# Patient Record
Sex: Male | Born: 1960 | Race: White | Hispanic: No | Marital: Single | Smoking: Never smoker
Health system: Southern US, Community
[De-identification: ages and names within clinical notes are randomized; demographics above are authoritative.]

## PROBLEM LIST (undated history)

## (undated) DIAGNOSIS — I639 Cerebral infarction, unspecified: Secondary | ICD-10-CM

## (undated) DIAGNOSIS — I739 Peripheral vascular disease, unspecified: Secondary | ICD-10-CM

## (undated) DIAGNOSIS — E119 Type 2 diabetes mellitus without complications: Secondary | ICD-10-CM

## (undated) DIAGNOSIS — I2510 Atherosclerotic heart disease of native coronary artery without angina pectoris: Secondary | ICD-10-CM

## (undated) DIAGNOSIS — I219 Acute myocardial infarction, unspecified: Secondary | ICD-10-CM

## (undated) DIAGNOSIS — I10 Essential (primary) hypertension: Secondary | ICD-10-CM

## (undated) HISTORY — PX: CORONARY ARTERY BYPASS GRAFT: SHX141

---

## 2015-06-19 ENCOUNTER — Inpatient Hospital Stay: Payer: Managed Care, Other (non HMO)

## 2015-06-19 ENCOUNTER — Inpatient Hospital Stay
Admission: AD | Admit: 2015-06-19 | Discharge: 2015-06-28 | DRG: 240 | Disposition: A | Discharge status: Skilled Nursing Facility | Payer: Managed Care, Other (non HMO) | Source: Other Acute Inpatient Hospital | Attending: Vascular Surgery | Admitting: Vascular Surgery

## 2015-06-19 DIAGNOSIS — M19071 Primary osteoarthritis, right ankle and foot: Secondary | ICD-10-CM | POA: Diagnosis present

## 2015-06-19 DIAGNOSIS — L02611 Cutaneous abscess of right foot: Secondary | ICD-10-CM | POA: Diagnosis present

## 2015-06-19 DIAGNOSIS — K5900 Constipation, unspecified: Secondary | ICD-10-CM | POA: Diagnosis not present

## 2015-06-19 DIAGNOSIS — E1122 Type 2 diabetes mellitus with diabetic chronic kidney disease: Secondary | ICD-10-CM | POA: Diagnosis present

## 2015-06-19 DIAGNOSIS — I129 Hypertensive chronic kidney disease with stage 1 through stage 4 chronic kidney disease, or unspecified chronic kidney disease: Secondary | ICD-10-CM | POA: Diagnosis present

## 2015-06-19 DIAGNOSIS — Z951 Presence of aortocoronary bypass graft: Secondary | ICD-10-CM | POA: Diagnosis not present

## 2015-06-19 DIAGNOSIS — I2510 Atherosclerotic heart disease of native coronary artery without angina pectoris: Secondary | ICD-10-CM | POA: Diagnosis present

## 2015-06-19 DIAGNOSIS — W450XXS Nail entering through skin, sequela: Secondary | ICD-10-CM

## 2015-06-19 DIAGNOSIS — Z79899 Other long term (current) drug therapy: Secondary | ICD-10-CM

## 2015-06-19 DIAGNOSIS — I255 Ischemic cardiomyopathy: Secondary | ICD-10-CM | POA: Diagnosis present

## 2015-06-19 DIAGNOSIS — I70269 Atherosclerosis of native arteries of extremities with gangrene, unspecified extremity: Secondary | ICD-10-CM | POA: Diagnosis present

## 2015-06-19 DIAGNOSIS — E1152 Type 2 diabetes mellitus with diabetic peripheral angiopathy with gangrene: Principal | ICD-10-CM | POA: Diagnosis present

## 2015-06-19 DIAGNOSIS — I70261 Atherosclerosis of native arteries of extremities with gangrene, right leg: Secondary | ICD-10-CM | POA: Diagnosis present

## 2015-06-19 DIAGNOSIS — Z794 Long term (current) use of insulin: Secondary | ICD-10-CM | POA: Diagnosis not present

## 2015-06-19 DIAGNOSIS — E11621 Type 2 diabetes mellitus with foot ulcer: Secondary | ICD-10-CM | POA: Diagnosis present

## 2015-06-19 DIAGNOSIS — Z8249 Family history of ischemic heart disease and other diseases of the circulatory system: Secondary | ICD-10-CM

## 2015-06-19 DIAGNOSIS — E1169 Type 2 diabetes mellitus with other specified complication: Secondary | ICD-10-CM | POA: Diagnosis present

## 2015-06-19 DIAGNOSIS — D638 Anemia in other chronic diseases classified elsewhere: Secondary | ICD-10-CM | POA: Diagnosis present

## 2015-06-19 DIAGNOSIS — N183 Chronic kidney disease, stage 3 (moderate): Secondary | ICD-10-CM | POA: Diagnosis present

## 2015-06-19 DIAGNOSIS — B9689 Other specified bacterial agents as the cause of diseases classified elsewhere: Secondary | ICD-10-CM | POA: Diagnosis present

## 2015-06-19 DIAGNOSIS — Z7982 Long term (current) use of aspirin: Secondary | ICD-10-CM | POA: Diagnosis not present

## 2015-06-19 DIAGNOSIS — S91331S Puncture wound without foreign body, right foot, sequela: Secondary | ICD-10-CM

## 2015-06-19 DIAGNOSIS — E1142 Type 2 diabetes mellitus with diabetic polyneuropathy: Secondary | ICD-10-CM | POA: Diagnosis present

## 2015-06-19 DIAGNOSIS — I70292 Other atherosclerosis of native arteries of extremities, left leg: Secondary | ICD-10-CM | POA: Diagnosis present

## 2015-06-19 DIAGNOSIS — L089 Local infection of the skin and subcutaneous tissue, unspecified: Secondary | ICD-10-CM

## 2015-06-19 DIAGNOSIS — Z7902 Long term (current) use of antithrombotics/antiplatelets: Secondary | ICD-10-CM

## 2015-06-19 DIAGNOSIS — I252 Old myocardial infarction: Secondary | ICD-10-CM

## 2015-06-19 DIAGNOSIS — Z8673 Personal history of transient ischemic attack (TIA), and cerebral infarction without residual deficits: Secondary | ICD-10-CM

## 2015-06-19 DIAGNOSIS — B9561 Methicillin susceptible Staphylococcus aureus infection as the cause of diseases classified elsewhere: Secondary | ICD-10-CM | POA: Diagnosis present

## 2015-06-19 DIAGNOSIS — L97519 Non-pressure chronic ulcer of other part of right foot with unspecified severity: Secondary | ICD-10-CM | POA: Diagnosis present

## 2015-06-19 DIAGNOSIS — E7800 Pure hypercholesterolemia, unspecified: Secondary | ICD-10-CM | POA: Diagnosis present

## 2015-06-19 DIAGNOSIS — E785 Hyperlipidemia, unspecified: Secondary | ICD-10-CM | POA: Diagnosis present

## 2015-06-19 DIAGNOSIS — Z833 Family history of diabetes mellitus: Secondary | ICD-10-CM

## 2015-06-19 DIAGNOSIS — Z95828 Presence of other vascular implants and grafts: Secondary | ICD-10-CM

## 2015-06-19 DIAGNOSIS — L97509 Non-pressure chronic ulcer of other part of unspecified foot with unspecified severity: Secondary | ICD-10-CM

## 2015-06-19 DIAGNOSIS — E11628 Type 2 diabetes mellitus with other skin complications: Secondary | ICD-10-CM

## 2015-06-19 DIAGNOSIS — E08621 Diabetes mellitus due to underlying condition with foot ulcer: Secondary | ICD-10-CM

## 2015-06-19 DIAGNOSIS — Z880 Allergy status to penicillin: Secondary | ICD-10-CM

## 2015-06-19 HISTORY — DX: Type 2 diabetes mellitus without complications: E11.9

## 2015-06-19 HISTORY — DX: Peripheral vascular disease, unspecified: I73.9

## 2015-06-19 HISTORY — DX: Cerebral infarction, unspecified: I63.9

## 2015-06-19 HISTORY — DX: Essential (primary) hypertension: I10

## 2015-06-19 HISTORY — DX: Acute myocardial infarction, unspecified: I21.9

## 2015-06-19 HISTORY — DX: Atherosclerotic heart disease of native coronary artery without angina pectoris: I25.10

## 2015-06-19 LAB — COMPREHENSIVE METABOLIC PANEL
ALT: 16 U/L — AB (ref 17–63)
ANION GAP: 7 (ref 5–15)
AST: 16 U/L (ref 15–41)
Albumin: 2.9 g/dL — ABNORMAL LOW (ref 3.5–5.0)
Alkaline Phosphatase: 69 U/L (ref 38–126)
BUN: 21 mg/dL — ABNORMAL HIGH (ref 6–20)
CALCIUM: 8.5 mg/dL — AB (ref 8.9–10.3)
CHLORIDE: 108 mmol/L (ref 101–111)
CO2: 22 mmol/L (ref 22–32)
CREATININE: 1.24 mg/dL (ref 0.61–1.24)
Glucose, Bld: 142 mg/dL — ABNORMAL HIGH (ref 65–99)
Potassium: 4.6 mmol/L (ref 3.5–5.1)
SODIUM: 137 mmol/L (ref 135–145)
Total Bilirubin: 0.5 mg/dL (ref 0.3–1.2)
Total Protein: 7.4 g/dL (ref 6.5–8.1)

## 2015-06-19 LAB — CBC
HCT: 32 % — ABNORMAL LOW (ref 40.0–52.0)
Hemoglobin: 10.1 g/dL — ABNORMAL LOW (ref 13.0–18.0)
MCH: 24.6 pg — AB (ref 26.0–34.0)
MCHC: 31.5 g/dL — AB (ref 32.0–36.0)
MCV: 78.1 fL — AB (ref 80.0–100.0)
PLATELETS: 273 10*3/uL (ref 150–440)
RBC: 4.09 MIL/uL — AB (ref 4.40–5.90)
RDW: 17.9 % — AB (ref 11.5–14.5)
WBC: 12.7 10*3/uL — ABNORMAL HIGH (ref 3.8–10.6)

## 2015-06-19 LAB — VANCOMYCIN, RANDOM: VANCOMYCIN RM: 12 ug/mL

## 2015-06-19 LAB — GLUCOSE, CAPILLARY: Glucose-Capillary: 172 mg/dL — ABNORMAL HIGH (ref 65–99)

## 2015-06-19 MED ORDER — SORBITOL 70 % SOLN
30.0000 mL | Freq: Every day | Status: DC | PRN
  Filled 2015-06-19: qty 30

## 2015-06-19 MED ORDER — LEVOFLOXACIN IN D5W 750 MG/150ML IV SOLN
750.0000 mg | Freq: Once | INTRAVENOUS | Status: AC
  Administered 2015-06-19: 750 mg via INTRAVENOUS
  Filled 2015-06-19: qty 150

## 2015-06-19 MED ORDER — MORPHINE SULFATE (PF) 2 MG/ML IV SOLN: 2.0000 mg | INTRAVENOUS | Status: DC | PRN

## 2015-06-19 MED ORDER — INSULIN ASPART 100 UNIT/ML ~~LOC~~ SOLN
0.0000 [IU] | Freq: Three times a day (TID) | SUBCUTANEOUS | Status: DC
  Administered 2015-06-21 – 2015-06-23 (×5): 2 [IU] via SUBCUTANEOUS
  Administered 2015-06-24: 3 [IU] via SUBCUTANEOUS
  Administered 2015-06-26 (×2): 2 [IU] via SUBCUTANEOUS
  Administered 2015-06-27 – 2015-06-28 (×3): 3 [IU] via SUBCUTANEOUS
  Filled 2015-06-19: qty 3
  Filled 2015-06-19 (×5): qty 2
  Filled 2015-06-19 (×2): qty 3
  Filled 2015-06-19 (×2): qty 2
  Filled 2015-06-19: qty 3

## 2015-06-19 MED ORDER — ACETAMINOPHEN 325 MG PO TABS: 650.0000 mg | ORAL_TABLET | Freq: Four times a day (QID) | ORAL | Status: DC | PRN

## 2015-06-19 MED ORDER — NITROGLYCERIN 0.4 MG SL SUBL: 0.4000 mg | SUBLINGUAL_TABLET | SUBLINGUAL | Status: DC | PRN

## 2015-06-19 MED ORDER — CHLORHEXIDINE GLUCONATE 4 % EX LIQD
60.0000 mL | Freq: Once | CUTANEOUS | Status: AC
  Administered 2015-06-19: 4 via TOPICAL

## 2015-06-19 MED ORDER — ZOLPIDEM TARTRATE 5 MG PO TABS: 5.0000 mg | ORAL_TABLET | Freq: Every evening | ORAL | Status: DC | PRN

## 2015-06-19 MED ORDER — VANCOMYCIN HCL IN DEXTROSE 1-5 GM/200ML-% IV SOLN
1000.0000 mg | Freq: Two times a day (BID) | INTRAVENOUS | Status: DC
  Filled 2015-06-19: qty 200

## 2015-06-19 MED ORDER — ONDANSETRON HCL 4 MG PO TABS: 4.0000 mg | ORAL_TABLET | Freq: Four times a day (QID) | ORAL | Status: DC | PRN

## 2015-06-19 MED ORDER — ATORVASTATIN CALCIUM 20 MG PO TABS
80.0000 mg | ORAL_TABLET | Freq: Every day | ORAL | Status: DC
  Administered 2015-06-20 – 2015-06-27 (×7): 80 mg via ORAL
  Filled 2015-06-19 (×7): qty 4

## 2015-06-19 MED ORDER — OXYCODONE HCL 5 MG PO TABS
10.0000 mg | ORAL_TABLET | ORAL | Status: DC | PRN
  Administered 2015-06-19 – 2015-06-28 (×25): 10 mg via ORAL
  Filled 2015-06-19 (×25): qty 2

## 2015-06-19 MED ORDER — MAGNESIUM HYDROXIDE 400 MG/5ML PO SUSP
30.0000 mL | Freq: Every day | ORAL | Status: DC | PRN
  Administered 2015-06-25: 30 mL via ORAL
  Filled 2015-06-19: qty 30

## 2015-06-19 MED ORDER — LEVOFLOXACIN IN D5W 750 MG/150ML IV SOLN
750.0000 mg | INTRAVENOUS | Status: DC
  Administered 2015-06-20 – 2015-06-23 (×3): 750 mg via INTRAVENOUS
  Filled 2015-06-19 (×5): qty 150

## 2015-06-19 MED ORDER — PNEUMOCOCCAL VAC POLYVALENT 25 MCG/0.5ML IJ INJ: 0.5000 mL | INJECTION | INTRAMUSCULAR | Status: DC

## 2015-06-19 MED ORDER — FLEET ENEMA 7-19 GM/118ML RE ENEM: 1.0000 | ENEMA | Freq: Once | RECTAL | Status: DC | PRN

## 2015-06-19 MED ORDER — CLINDAMYCIN PHOSPHATE 900 MG/50ML IV SOLN
900.0000 mg | INTRAVENOUS | Status: AC
  Administered 2015-06-20: 900 mg via INTRAVENOUS
  Filled 2015-06-19: qty 50

## 2015-06-19 MED ORDER — ONDANSETRON HCL 4 MG/2ML IJ SOLN: 4.0000 mg | Freq: Four times a day (QID) | INTRAMUSCULAR | Status: DC | PRN

## 2015-06-19 MED ORDER — ASPIRIN EC 81 MG PO TBEC
81.0000 mg | DELAYED_RELEASE_TABLET | Freq: Every day | ORAL | Status: DC
  Administered 2015-06-20 – 2015-06-28 (×7): 81 mg via ORAL
  Filled 2015-06-19 (×9): qty 1

## 2015-06-19 MED ORDER — SODIUM CHLORIDE 0.9 % IV SOLN
1500.0000 mg | INTRAVENOUS | Status: DC
  Administered 2015-06-19 – 2015-06-20 (×2): 1500 mg via INTRAVENOUS
  Filled 2015-06-19 (×3): qty 1500

## 2015-06-19 MED ORDER — SODIUM CHLORIDE 0.9 % IV SOLN
1500.0000 mg | Freq: Once | INTRAVENOUS | Status: DC
  Filled 2015-06-19: qty 1500

## 2015-06-19 MED ORDER — ENOXAPARIN SODIUM 40 MG/0.4ML ~~LOC~~ SOLN
40.0000 mg | SUBCUTANEOUS | Status: DC
  Administered 2015-06-20 – 2015-06-27 (×8): 40 mg via SUBCUTANEOUS
  Filled 2015-06-19 (×8): qty 0.4

## 2015-06-19 MED ORDER — DOCUSATE SODIUM 100 MG PO CAPS
100.0000 mg | ORAL_CAPSULE | Freq: Two times a day (BID) | ORAL | Status: DC
  Administered 2015-06-19 – 2015-06-28 (×17): 100 mg via ORAL
  Filled 2015-06-19 (×17): qty 1

## 2015-06-19 MED ORDER — POVIDONE-IODINE 10 % EX SWAB
2.0000 "application " | Freq: Once | CUTANEOUS | Status: AC
  Administered 2015-06-19: 2 via TOPICAL

## 2015-06-19 MED ORDER — CARVEDILOL 12.5 MG PO TABS
25.0000 mg | ORAL_TABLET | Freq: Two times a day (BID) | ORAL | Status: DC
  Administered 2015-06-20 – 2015-06-22 (×4): 25 mg via ORAL
  Filled 2015-06-19 (×4): qty 2

## 2015-06-19 MED ORDER — ACETAMINOPHEN 650 MG RE SUPP: 650.0000 mg | Freq: Four times a day (QID) | RECTAL | Status: DC | PRN

## 2015-06-19 MED ORDER — LISINOPRIL 5 MG PO TABS
2.5000 mg | ORAL_TABLET | Freq: Every day | ORAL | Status: DC
  Administered 2015-06-20 – 2015-06-22 (×3): 2.5 mg via ORAL
  Filled 2015-06-19 (×4): qty 1

## 2015-06-19 NOTE — H&P (Signed)
[facility] SPECIAL[facility] Admission History & Physical  MRN : [id number]  Edward Boyer is a 55 y.o. (April 19, 1960) male who presents with chief complaint of gangrene right second toe.  History of Present Illness: The patient is a 55 year old gentleman transferred from the [facility] for further workup and treatment of gangrenous changes to the right second toe. Approximately 3 weeks ago the patient underwent right foot surgery to remove a nail like piece of metal. Surgery was successful x-rays at the time did not reveal any retained foreign body. He was placed on antibiotics and seen in follow-up approximately one week later with what appeared to be a healing foot. He presented back to his surgeon's office yesterday and was found to have a mummified second toe. Vascular services are not available at this time and [facility] and I was contacted for transfer which accepted.  Patient does attest to significant pain in his right foot and moderate swelling. He denies fever or chills. He states his blood sugars have been under very good control consistently less than 150.  Current Facility-Administered Medications  Medication Dose Route Frequency Provider Last Rate Last Dose  . acetaminophen (TYLENOL) tablet 650 mg  650 mg Oral Q6H PRN Renford Dills, MD       Or  . acetaminophen (TYLENOL) suppository 650 mg  650 mg Rectal Q6H PRN Renford Dills, MD      . aspirin EC tablet 81 mg  81 mg Oral Daily Renford Dills, MD      . Melene Muller ON 06/20/2015] atorvastatin (LIPITOR) tablet 80 mg  80 mg Oral q1800 Renford Dills, MD      . Melene Muller ON 06/20/2015] carvedilol (COREG) tablet 25 mg  25 mg Oral BID WC Renford Dills, MD      . docusate sodium (COLACE) capsule 100 mg  100 mg Oral BID Renford Dills, MD      . enoxaparin (LOVENOX) injection 30 mg  30 mg Subcutaneous Q24H Renford Dills, MD      . Melene Muller ON 06/20/2015] insulin aspart (novoLOG) injection 0-15 Units   0-15 Units Subcutaneous TID WC Renford Dills, MD      . lisinopril (PRINIVIL,ZESTRIL) tablet 2.5 mg  2.5 mg Oral Daily Renford Dills, MD      . magnesium hydroxide (MILK OF MAGNESIA) suspension 30 mL  30 mL Oral Daily PRN Renford Dills, MD      . morphine 2 MG/ML injection 2 mg  2 mg Intravenous Q1H PRN Renford Dills, MD      . nitroGLYCERIN (NITROSTAT) SL tablet 0.4 mg  0.4 mg Sublingual Q5 min PRN Renford Dills, MD      . ondansetron [facility]) tablet 4 mg  4 mg Oral Q6H PRN Renford Dills, MD       Or  . ondansetron [facility]) injection 4 mg  4 mg Intravenous Q6H PRN Renford Dills, MD      . oxyCODONE (Oxy IR/ROXICODONE) immediate release tablet 10 mg  10 mg Oral Q3H PRN Renford Dills, MD      . Melene Muller ON 06/20/2015] pneumococcal 23 valent vaccine (PNU-IMMUNE) injection 0.5 mL  0.5 mL Intramuscular Tomorrow-1000 Renford Dills, MD      . sodium phosphate (FLEET) 7-19 GM/118ML enema 1 enema  1 enema Rectal Once PRN Renford Dills, MD      . sorbitol 70 % solution 30 mL  30 mL Oral Daily PRN  Renford DillsGregory G Schnier, MD      . zolpidem [facility](AMBIEN) tablet 5 mg  5 mg Oral QHS PRN,MR X 1 Renford DillsGregory G Schnier, MD        Past Medical History  Diagnosis Date  . Diabetes mellitus without complication (HCC)   . Myocardial infarction (HCC)   . Coronary artery disease   . Hypertension   . Peripheral vascular disease (HCC)   . Stroke [facility](HCC)     Past Surgical History  Procedure Laterality Date  . Coronary artery bypass graft      Social History Social History  Substance Use Topics  . Smoking status: Never Smoker   . Smokeless tobacco: Not on file  . Alcohol Use: Not on file    Family History No family history on file. No family history of bleeding clotting disorders, porphyria or autoimmune disease.  Allergies  Allergen Reactions  . Penicillins      REVIEW OF SYSTEMS (Negative unless checked)  Constitutional: [] Weight loss  [] Fever  [] Chills Cardiac: [] Chest  pain   [] Chest pressure   [] Palpitations   [] Shortness of breath when laying flat   [] Shortness of breath at rest   [] Shortness of breath with exertion. Vascular:  [] Pain in legs with walking   [] Pain in legs at rest   [] Pain in legs when laying flat   [] Claudication   [x] Pain in feet when walking  [x] Pain in feet at rest  [x] Pain in feet when laying flat   [] History of DVT   [] Phlebitis   [] Swelling in legs   [] Varicose veins   [x] Non-healing ulcers Pulmonary:   [] Uses home oxygen   [] Productive cough   [] Hemoptysis   [] Wheeze  [] COPD   [] Asthma Neurologic:  [] Dizziness  [] Blackouts   [] Seizures   [] History of stroke   [] History of TIA  [] Aphasia   [] Temporary blindness   [] Dysphagia   [] Weakness or numbness in arms   [] Weakness or numbness in legs Musculoskeletal:  [] Arthritis   [] Joint swelling   [] Joint pain   [] Low back pain Hematologic:  [] Easy bruising  [] Easy bleeding   [] Hypercoagulable state   [] Anemic  [] Hepatitis Gastrointestinal:  [] Blood in stool   [] Vomiting blood  [] Gastroesophageal reflux/heartburn   [] Difficulty swallowing. Genitourinary:  [x] Chronic kidney disease   [] Difficult urination  [] Frequent urination  [] Burning with urination   [] Blood in urine Skin:  [] Rashes   [x] Ulcers   [] Wounds Psychological:  [] History of anxiety   []  History of major depression.  Physical Examination  Filed Vitals:   06/19/15 1752 06/19/15 1754  BP: 146/73   Pulse: 90   Temp: 99 F (37.2 C)   Resp: 17   Height:  5\' 11"  (1.803 m)  Weight:  107.049 kg (236 lb)  SpO2: 100%    Body mass index is 32.93 kg/(m^2). Gen: WD/WN, NAD Head: [place]/AT, No temporalis wasting.  Ear/Nose/Throat: Hearing grossly intact, nares w/o erythema or drainage, oropharynx w/o Erythema/Exudate, Eyes: PERRLA, EOMI.  Neck: Supple, no nuchal rigidity.  No bruit or JVD.  Pulmonary:  Good air movement, clear to auscultation bilaterally, no increased work of respiration or use of accessory muscles  Cardiac: RRR, normal  S1, S2, no Murmurs, rubs or gallops.Well-healed median sternotomy scar Vascular: Mummified changes to the right second toe foot is edematous with some areas of skin damage but no open draining ulcers are noted. I do not appreciate crepitance Vessel Right Left  Radial Palpable Palpable  Ulnar Palpable Palpable  Brachial Palpable Palpable  Carotid Palpable, without  bruit Palpable, without bruit  Aorta Not palpable N/A  Femoral Palpable Palpable  Popliteal Palpable Palpable  PT Not Palpable Not Palpable  DP Not Palpable Not Palpable   Gastrointestinal: soft, non-tender/non-distended. No guarding/reflex. No masses, surgical incisions, or scars. Musculoskeletal: M/S 5/5 throughout.  No deformity or atrophy. Neurologic: CN 2-12 intact. Pain and light touch intact in extremities.  Symmetrical.  Speech is fluent. Motor exam as listed above. Psychiatric: Judgment intact, Mood & affect appropriate for pt's clinical situation. Dermatologic: No rashes or ulcers notedOf the upper extremities.  Right foot cellulitis wounds as described above. Lymph : No Cervical, Axillary, or Inguinal lymphadenopathy.   CBC No results found for: WBC, HGB, HCT, MCV, PLT  BMET No results found for: NA, K, CL, CO2, GLUCOSE, BUN, CREATININE, CALCIUM, GFRNONAA, GFRAA CrCl cannot be calculated (Patient has no serum creatinine result on file.).  COAG No results found for: INR, PROTIME  Radiology No results found.  Assessment/Plan 1.  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 My plan will be to drain and/or control the infection prior to revascularization if feasible. Patient already understands that the toe will require amputation. 2.  Diabetic foot infection:  I will obtain plain films I've discussed the  case with Dr. Ether Griffins and he will evaluate the patient for further incision and drainage. I will also begin antibiotics. 3.  Diabetes mellitus:  I will maintain the patient on sliding scale for now until we determine whether surgical intervention is required. I will check an A1c. I have asked medicine to follow the patient for his medical comorbidities. 4.  Coronary artery disease:  His Coreg and Zestril will be continued, medicine will follow. 5.  Hypertension:  Antihypertensives will be continued. 6.  Hypercholesterolemia:  His statin therapy will be continued.   Schnier, Latina Craver, MD  06/19/2015 7:12 PM

## 2015-06-19 NOTE — Plan of Care (Signed)
It should be noted I attempted to order vancomycin and Levaquin however the Epic program refused to allow this was blocked and rather than let me order the appropriate therapy these orders were raised and a cellulitis order set was opened which was not appropriate for this clinical situation.  I therefore spoke with the pharmacist by phone and we have both agreed that he will be started on vancomycin and Levaquin she is offered to place these orders since Epic is preventing me from placing appropriate orders

## 2015-06-19 NOTE — Consult Note (Signed)
ORTHOPAEDIC CONSULTATION  REQUESTING PHYSICIAN: Renford DillsGregory G Schnier, MD  Chief Complaint: Right foot infection.  HPI: Edward Boyer is a 55 y.o. male who complains of  infection to his right foot. Approximately 3 weeks ago he stepped on a staple and was seen in [place]. I&D was performed and piece of staple was removed. The patient was not sure how long it had been in his foot as he is diabetic with neuropathy. Seemed to be healing well until until recently. Was seen this past week in [place] and noted to have infection with gangrenous changes of the second toe. Subsequently referred to our hospital for further evaluation and treatment. I was consulted secondary to the infection the foot for I&D of the area. He is been seen by vascular surgery and revascularization is to be performed this coming week.  Past Medical History  Diagnosis Date  . Diabetes mellitus without complication (HCC)   . Myocardial infarction (HCC)   . Coronary artery disease   . Hypertension   . Peripheral vascular disease (HCC)   . Stroke [facility](HCC)    Past Surgical History  Procedure Laterality Date  . Coronary artery bypass graft    Recent right foot I & D  Social History   Social History  . Marital Status: Single    Spouse Name: N/A  . Number of Children: N/A  . Years of Education: N/A   Social History Main Topics  . Smoking status: Never Smoker   . Smokeless tobacco: None  . Alcohol Use: None  . Drug Use: None  . Sexual Activity: Not Asked   Other Topics Concern  . None   Social History Narrative  . None   History reviewed. No pertinent family history. Allergies  Allergen Reactions  . Penicillins    Prior to Admission medications   Medication Sig Start Date End Date Taking? Authorizing Provider  acidophilus (RISAQUAD) CAPS capsule Take by mouth 2 (two) times daily.   Yes Historical Provider, MD  aspirin 81 MG tablet Take 81 mg by mouth daily.   Yes Historical Provider, MD   atorvastatin (LIPITOR) 80 MG tablet Take 80 mg by mouth at bedtime.   Yes Historical Provider, MD  carvedilol (COREG) 25 MG tablet Take 25 mg by mouth 2 (two) times daily with a meal.   Yes Historical Provider, MD  clopidogrel (PLAVIX) 75 MG tablet Take 75 mg by mouth daily.   Yes Historical Provider, MD  insulin NPH-regular Human (NOVOLIN 70/30) (70-30) 100 UNIT/ML injection Inject 30 Units into the skin daily with breakfast.   Yes Historical Provider, MD  insulin NPH-regular Human (NOVOLIN 70/30) (70-30) 100 UNIT/ML injection Inject 10 Units into the skin daily with supper.   Yes Historical Provider, MD  lisinopril (PRINIVIL,ZESTRIL) 2.5 MG tablet Take 2.5 mg by mouth daily.   Yes Historical Provider, MD  metFORMIN (GLUCOPHAGE) 1000 MG tablet Take 1,000 mg by mouth daily before supper.   Yes Historical Provider, MD  nitroGLYCERIN (NITRODUR - DOSED IN MG/24 HR) 0.4 mg/hr patch Place 0.4 mg under the tongue every 5 (five) minutes x 3 doses as needed.   Yes Historical Provider, MD  oxyCODONE-acetaminophen (PERCOCET/ROXICET) 5-325 MG tablet Take 1 tablet by mouth every 4 (four) hours as needed for severe pain.   Yes Historical Provider, MD   Dg Foot 2 Views Right  06/19/2015  CLINICAL DATA:  Patient stepped on a piece of metal that went through his foot mid May. The foreign body was removed and  infection set in. He has redness to the posterior ball of his foot, the anterior foot including his toes. His 2nd digit is shrunken and black. There is visible redness and swelling to the adjacent digits. Some dried blisters/scabs. Hx diabetes, mi, cad, htn, pvd, stroke EXAM: RIGHT FOOT - 2 VIEW COMPARISON:  None. FINDINGS: No fracture.  Joints are normally spaced and aligned. No radiopaque foreign body. There are no areas of bone resorption to suggest osteomyelitis. There are prominent dorsal and plantar calcaneal spurs. IMPRESSION: 1. No fracture dislocation. 2. No radiopaque foreign body. 3. No radiographic  evidence of osteomyelitis. Electronically Signed   By: Amie Portland M.D.   On: 06/19/2015 20:00    Positive ROS: All other systems have been reviewed and were otherwise negative with the exception of those mentioned in the HPI and as above.  12 point ROS was performed.  Physical Exam: General: Alert and oriented.  No apparent distress.  Vascular:  Left foot:Dorsalis Pedis:  absent Posterior Tibial:  absent  Right foot: Dorsalis Pedis:  absent Posterior Tibial:  absent  Fill time is delayed and toes 3,4,5. Mental Fill time to the great toe. Second toe is necrotic  Neuro:absent gross or protective sensation to the right or left foot.  Derm: No ulcerative sites on the left foot. Right foot with dry gangrene to the second toe to the metatarsophalangeal joint with noted necrosis in the first webspace. There is purulent drainage coming from the plantar medial aspect of the first metatarsal with a small open abscessed area. There is a puncture site plantar to the second metatarsal head. Peeling of the skin surrounding the distal foot is noted. Erythema to the dorsal foot is noted. Some mottling of the great toe is noted at this time as well.  Ortho/MS: Diffuse right foot edema noted from the ankle distally. He does demonstrate dorsiflexion of the ankle joint. No calf pain. He does have pain with pressure to the forefoot area around the necrotic second metatarsophalangeal joint and great toe joint. No obvious crepitance of the skin though the plantar aspect of the first metatarsal head is fluctuant somewhat.  Assessment:  Recent puncture wound with foreign body nail with abscess of the plantar aspect of the right forefoot and gangrene of the second toe. Diabetes with neuropathy.  Plan:  X-rays were negative for obvious os myelitis. He has definite gangrenous changes to the second toe to the metatarsophalangeal joint regions with area of skin necrosis in the first webspace, more concerning is the  fluctuant area under the plantar aspect of the first MTPJ with a small puncture abscess. I was able to perform a deep culture of this wound and this was sent for routine culture. He will definitely needs I&D with amputation of the second toe. Will plan to perform this tomorrow evening. MRI will be scheduled for tomorrow morning and can plan on surgical intervention thereafter. He will be nothing by mouth after 7 AM tomorrow and allow a liquid breakfast in the morning.    Irean Hong, DPM Cell [phone]   06/19/2015 8:59 PM

## 2015-06-19 NOTE — Consult Note (Addendum)
Pharmacy Antibiotic Note  Edward Boyer is a 55 y.o. male admitted on 06/19/2015 with diabetic food.  Pharmacy has been consulted for vancomycin and levofloxacin dosing.  [facility] [place] and spoke with [employer] Rph. States pt received 1750mg  of vancomycin @ 1400 on 6/16. His scr was 1.35 and weight was 104kg- adjusted weight crcl is 5278ml/min. Pt transferred prior to getting a second dose of vancomycin. Pt was on levofloxacin and clindamycin at home.  It has been 29.5 hours since last dose of vancomycin. Random level resulted at 12.    Plan: Will give vancomycin 1500mg  q 24 hours  Based on a caculated t1/2 of 18.7hr vd=74.9 and ke=0.037 Due to pt size he is at risk for accumulation. Monitor closely. Will draw trough at steady state 6/20 2030 Levofloxacin 750mg  q 24 hours   Height: 5\' 11"  (180.3 cm) Weight: 236 lb (107.049 kg) IBW/kg (Calculated) : 75.3  Temp (24hrs), Avg:99 F (37.2 C), Min:99 F (37.2 C), Max:99 F (37.2 C)   Recent Labs Lab 06/19/15 1929  WBC 12.7*    CrCl cannot be calculated (Patient has no serum creatinine result on file.).    Allergies  Allergen Reactions  . Penicillins     Antimicrobials this admission: vancomycin 6/17 >>  levofloaxin 6/17 >>   Dose adjustments this admission:   Microbiology results:   Thank you for allowing pharmacy to be a part of this patient's care.  Olene FlossMelissa D Maccia, Pharm.D Clinical Pharmacist   06/19/2015 7:50 PM

## 2015-06-20 ENCOUNTER — Inpatient Hospital Stay: Payer: Managed Care, Other (non HMO) | Admitting: Certified Registered Nurse Anesthetist

## 2015-06-20 ENCOUNTER — Encounter: Payer: Self-pay | Admitting: Internal Medicine

## 2015-06-20 ENCOUNTER — Encounter
Admission: AD | Disposition: A | Discharge status: Skilled Nursing Facility | Payer: Self-pay | Source: Other Acute Inpatient Hospital | Attending: Vascular Surgery

## 2015-06-20 ENCOUNTER — Inpatient Hospital Stay: Payer: Managed Care, Other (non HMO)

## 2015-06-20 HISTORY — PX: AMPUTATION TOE: SHX6595

## 2015-06-20 HISTORY — PX: IRRIGATION AND DEBRIDEMENT FOOT: SHX6602

## 2015-06-20 LAB — CBC
HCT: 30.4 % — ABNORMAL LOW (ref 40.0–52.0)
Hemoglobin: 9.6 g/dL — ABNORMAL LOW (ref 13.0–18.0)
MCH: 24.5 pg — AB (ref 26.0–34.0)
MCHC: 31.7 g/dL — AB (ref 32.0–36.0)
MCV: 77.3 fL — ABNORMAL LOW (ref 80.0–100.0)
Platelets: 281 10*3/uL (ref 150–440)
RBC: 3.94 MIL/uL — ABNORMAL LOW (ref 4.40–5.90)
RDW: 18.2 % — AB (ref 11.5–14.5)
WBC: 12.2 10*3/uL — ABNORMAL HIGH (ref 3.8–10.6)

## 2015-06-20 LAB — SURGICAL PCR SCREEN
MRSA, PCR: NEGATIVE
Staphylococcus aureus: NEGATIVE

## 2015-06-20 LAB — GLUCOSE, CAPILLARY
GLUCOSE-CAPILLARY: 96 mg/dL (ref 65–99)
GLUCOSE-CAPILLARY: 98 mg/dL (ref 65–99)
GLUCOSE-CAPILLARY: 103 mg/dL — AB (ref 65–99)
Glucose-Capillary: 77 mg/dL (ref 65–99)
Glucose-Capillary: 102 mg/dL — ABNORMAL HIGH (ref 65–99)

## 2015-06-20 LAB — HEMOGLOBIN A1C: Hgb A1c MFr Bld: 6.7 % — ABNORMAL HIGH (ref 4.0–6.0)

## 2015-06-20 SURGERY — AMPUTATION, TOE
Anesthesia: General | Site: Foot | Laterality: Right | Wound class: Dirty or Infected

## 2015-06-20 MED ORDER — MIDAZOLAM HCL 2 MG/2ML IJ SOLN
INTRAMUSCULAR | Status: DC | PRN
  Administered 2015-06-20: 2 mg via INTRAVENOUS

## 2015-06-20 MED ORDER — PHENYLEPHRINE HCL 10 MG/ML IJ SOLN
INTRAMUSCULAR | Status: DC | PRN
  Administered 2015-06-20 (×3): 100 ug via INTRAVENOUS
  Administered 2015-06-20: 200 ug via INTRAVENOUS

## 2015-06-20 MED ORDER — GENTAMICIN SULFATE 40 MG/ML IJ SOLN
INTRAMUSCULAR | Status: AC
Start: 2015-06-20 — End: 2015-06-20
  Filled 2015-06-20: qty 2

## 2015-06-20 MED ORDER — VANCOMYCIN HCL 1000 MG IV SOLR
INTRAVENOUS | Status: AC
Start: 2015-06-20 — End: 2015-06-20
  Filled 2015-06-20: qty 1000

## 2015-06-20 MED ORDER — LIDOCAINE HCL (CARDIAC) 20 MG/ML IV SOLN
INTRAVENOUS | Status: DC | PRN
  Administered 2015-06-20: 50 mg via INTRAVENOUS

## 2015-06-20 MED ORDER — SODIUM CHLORIDE 0.9 % IV SOLN
INTRAVENOUS | Status: DC | PRN
  Administered 2015-06-20: 14:00:00 via INTRAVENOUS

## 2015-06-20 MED ORDER — FENTANYL CITRATE (PF) 100 MCG/2ML IJ SOLN
INTRAMUSCULAR | Status: DC | PRN
  Administered 2015-06-20 (×2): 50 ug via INTRAVENOUS

## 2015-06-20 MED ORDER — ONDANSETRON HCL 4 MG/2ML IJ SOLN
4.0000 mg | Freq: Once | INTRAMUSCULAR | Status: DC | PRN
Start: 2015-06-20 — End: 2015-06-20

## 2015-06-20 MED ORDER — FENTANYL CITRATE (PF) 100 MCG/2ML IJ SOLN
25.0000 ug | INTRAMUSCULAR | Status: DC | PRN
Start: 2015-06-20 — End: 2015-06-20

## 2015-06-20 MED ORDER — ACETAMINOPHEN 10 MG/ML IV SOLN
INTRAVENOUS | Status: AC
Start: 2015-06-20 — End: 2015-06-20
  Filled 2015-06-20: qty 100

## 2015-06-20 MED ORDER — OXYCODONE-ACETAMINOPHEN 5-325 MG PO TABS
1.0000 | ORAL_TABLET | ORAL | Status: DC | PRN
  Administered 2015-06-20: 1 via ORAL
  Administered 2015-06-21 (×2): 2 via ORAL
  Filled 2015-06-20 (×2): qty 2
  Filled 2015-06-20: qty 1

## 2015-06-20 MED ORDER — LIDOCAINE HCL (PF) 1 % IJ SOLN
INTRAMUSCULAR | Status: AC
Start: 2015-06-20 — End: 2015-06-20
  Filled 2015-06-20: qty 30

## 2015-06-20 MED ORDER — BUPIVACAINE HCL 0.5 % IJ SOLN
INTRAMUSCULAR | Status: DC | PRN
  Administered 2015-06-20: 10 mL

## 2015-06-20 MED ORDER — EPHEDRINE SULFATE 50 MG/ML IJ SOLN
INTRAMUSCULAR | Status: DC | PRN
  Administered 2015-06-20 (×2): 5 mg via INTRAVENOUS

## 2015-06-20 MED ORDER — BUPIVACAINE HCL (PF) 0.5 % IJ SOLN
INTRAMUSCULAR | Status: AC
  Filled 2015-06-20: qty 30

## 2015-06-20 MED ORDER — PROPOFOL 10 MG/ML IV BOLUS
INTRAVENOUS | Status: DC | PRN
  Administered 2015-06-20: 170 mg via INTRAVENOUS

## 2015-06-20 MED ORDER — ACETAMINOPHEN 10 MG/ML IV SOLN
INTRAVENOUS | Status: DC | PRN
  Administered 2015-06-20: 1000 mg via INTRAVENOUS

## 2015-06-20 SURGICAL SUPPLY — 69 items
BANDAGE ACE 4X5 VEL STRL LF (GAUZE/BANDAGES/DRESSINGS) IMPLANT
BANDAGE ELASTIC 4 LF NS (GAUZE/BANDAGES/DRESSINGS) IMPLANT
BANDAGE STRETCH 3X4.1 STRL (GAUZE/BANDAGES/DRESSINGS) IMPLANT
BLADE OSC/SAGITTAL MD 5.5X18 (BLADE) ×3 IMPLANT
BLADE OSCILLATING/SAGITTAL (BLADE)
BLADE SURG MINI STRL (BLADE) ×3 IMPLANT
BLADE SW THK.38XMED LNG THN (BLADE) IMPLANT
BNDG COHESIVE 4X5 TAN STRL (GAUZE/BANDAGES/DRESSINGS) IMPLANT
BNDG COHESIVE 6X5 TAN STRL LF (GAUZE/BANDAGES/DRESSINGS) IMPLANT
BNDG ESMARK 4X12 TAN STRL LF (GAUZE/BANDAGES/DRESSINGS) ×3 IMPLANT
BNDG GAUZE 4.5X4.1 6PLY STRL (MISCELLANEOUS) ×3 IMPLANT
CANISTER SUCT 1200ML W/VALVE (MISCELLANEOUS) IMPLANT
CANISTER SUCT 3000ML (MISCELLANEOUS) ×6 IMPLANT
CUFF TOURN 18 STER (MISCELLANEOUS) IMPLANT
CUFF TOURN DUAL PL 12 NO SLV (MISCELLANEOUS) IMPLANT
DRAPE FLUOR MINI C-ARM 54X84 (DRAPES) ×3 IMPLANT
DRAPE XRAY CASSETTE 23X24 (DRAPES) IMPLANT
DRESSING ALLEVYN 4X4 (MISCELLANEOUS) IMPLANT
DURAPREP 26ML APPLICATOR (WOUND CARE) ×3 IMPLANT
ELECT REM PT RETURN 9FT ADLT (ELECTROSURGICAL) ×3
ELECTRODE REM PT RTRN 9FT ADLT (ELECTROSURGICAL) ×2 IMPLANT
GAUZE IODOFORM PACK 1/2 7832 (GAUZE/BANDAGES/DRESSINGS) IMPLANT
GAUZE PACKING 1/4X5YD (GAUZE/BANDAGES/DRESSINGS) IMPLANT
GAUZE PACKING IODOFORM 1X5 (MISCELLANEOUS) IMPLANT
GAUZE PETRO XEROFOAM 1X8 (MISCELLANEOUS) IMPLANT
GAUZE SPONGE 4X4 12PLY STRL (GAUZE/BANDAGES/DRESSINGS) IMPLANT
GAUZE STRETCH 2X75IN STRL (MISCELLANEOUS) IMPLANT
GLOVE BIO SURGEON STRL SZ7.5 (GLOVE) ×9 IMPLANT
GLOVE INDICATOR 8.0 STRL GRN (GLOVE) ×3 IMPLANT
GOWN STRL REUS W/ TWL LRG LVL3 (GOWN DISPOSABLE) ×4 IMPLANT
GOWN STRL REUS W/TWL LRG LVL3 (GOWN DISPOSABLE) ×2
GOWN STRL REUS W/TWL MED LVL3 (GOWN DISPOSABLE) ×6 IMPLANT
HANDPIECE SUCTION TUBG SURGILV (MISCELLANEOUS) IMPLANT
HANDPIECE VERSAJET DEBRIDEMENT (MISCELLANEOUS) ×3 IMPLANT
IV NS 1000ML (IV SOLUTION) ×1
IV NS 1000ML BAXH (IV SOLUTION) ×2 IMPLANT
KIT DRSG VAC SLVR GRANUFM (MISCELLANEOUS) ×3 IMPLANT
KIT RM TURNOVER STRD PROC AR (KITS) ×3 IMPLANT
KIT STIMULAN RAPID CURE 5CC (Orthopedic Implant) ×3 IMPLANT
LABEL OR SOLS (LABEL) ×3 IMPLANT
NEEDLE FILTER BLUNT 18X 1/2SAF (NEEDLE)
NEEDLE FILTER BLUNT 18X1 1/2 (NEEDLE) IMPLANT
NEEDLE HYPO 25X1 1.5 SAFETY (NEEDLE) ×3 IMPLANT
NS IRRIG 500ML POUR BTL (IV SOLUTION) ×3 IMPLANT
PACK EXTREMITY ARMC (MISCELLANEOUS) ×3 IMPLANT
PAD ABD DERMACEA PRESS 5X9 (GAUZE/BANDAGES/DRESSINGS) ×3 IMPLANT
PAD NEG PRESSURE SENSATRAC (MISCELLANEOUS) ×3 IMPLANT
RASP SM TEAR CROSS CUT (RASP) IMPLANT
SOL .9 NS 3000ML IRR  AL (IV SOLUTION)
SOL .9 NS 3000ML IRR UROMATIC (IV SOLUTION) IMPLANT
SOL PREP PVP 2OZ (MISCELLANEOUS)
SOLUTION PREP PVP 2OZ (MISCELLANEOUS) IMPLANT
STOCKINETTE IMPERVIOUS 9X36 MD (GAUZE/BANDAGES/DRESSINGS) ×3 IMPLANT
STOCKINETTE M/LG 89821 (MISCELLANEOUS) ×3 IMPLANT
STRAP SAFETY BODY (MISCELLANEOUS) ×3 IMPLANT
SUT ETHILON 2 0 FS 18 (SUTURE) ×6 IMPLANT
SUT ETHILON 3-0 FS-10 30 BLK (SUTURE) ×3
SUT ETHILON 4-0 (SUTURE) ×1
SUT ETHILON 4-0 FS2 18XMFL BLK (SUTURE) ×2
SUT ETHILON 5-0 FS-2 18 BLK (SUTURE) ×3 IMPLANT
SUT VIC AB 3-0 SH 27 (SUTURE) ×1
SUT VIC AB 3-0 SH 27X BRD (SUTURE) ×2 IMPLANT
SUT VIC AB 4-0 FS2 27 (SUTURE) ×3 IMPLANT
SUTURE EHLN 3-0 FS-10 30 BLK (SUTURE) ×2 IMPLANT
SUTURE ETHLN 4-0 FS2 18XMF BLK (SUTURE) ×2 IMPLANT
SWAB CULTURE AMIES ANAERIB BLU (MISCELLANEOUS) ×3 IMPLANT
SYR 3ML LL SCALE MARK (SYRINGE) IMPLANT
SYRINGE 10CC LL (SYRINGE) ×3 IMPLANT
WND VAC CANISTER 500ML (MISCELLANEOUS) ×3 IMPLANT

## 2015-06-20 NOTE — Progress Notes (Signed)
[facility]  Daily Progress Note   Subjective   Patient notes pain in his foot is significantly decreased. He is scheduled for debridement by Dr. Ether GriffinsFowler  Objective Filed Vitals:   06/19/15 2109 06/20/15 0617 06/20/15 0803 06/20/15 1217  BP:  118/69 122/65 108/58  Pulse: 89 79 79 71  Temp:  98.2 F (36.8 C) 98.7 F (37.1 C) 98.1 F (36.7 C)  TempSrc: Oral  Oral Oral  Resp:  18 16 21   Height:      Weight:      SpO2:  97% 100% 99%    Intake/Output Summary (Last 24 hours) at 06/20/15 1340 Last data filed at 06/20/15 1200  Gross per 24 hour  Intake      0 ml  Output    400 ml  Net   -400 ml    PULM  Normal effort , no use of accessory muscles CV  No JVD, RRR Abd      No distended, nontender VASC  Erythema and edema of the right foot gangrenous changes to the right second toe as noted. Pedal pulses are nonpalpable  Laboratory CBC    Component Value Date/Time   WBC 12.2* 06/20/2015 0448   HGB 9.6* 06/20/2015 0448   HCT 30.4* 06/20/2015 0448   PLT 281 06/20/2015 0448    BMET    Component Value Date/Time   NA 137 06/19/2015 1929   K 4.6 06/19/2015 1929   CL 108 06/19/2015 1929   CO2 22 06/19/2015 1929   GLUCOSE 142* 06/19/2015 1929   BUN 21* 06/19/2015 1929   CREATININE 1.24 06/19/2015 1929   CALCIUM 8.5* 06/19/2015 1929   GFRNONAA >60 06/19/2015 1929   GFRAA >60 06/19/2015 1929    Assessment/Planning: 1. 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  He is to be debrided today. Based on the outcome of this I will plan for angiography with intervention tomorrow. 2. Diabetic foot infection: Patient is on antibiotics. He is currently going to the operating room for debridement. 3. Diabetes mellitus: I will maintain the patient on sliding scale for now until we determine  whether surgical intervention is required. A1c is pending . I have asked medicine to follow the patient for his medical comorbidities. 4. Coronary artery disease: His Coreg and Zestril will be continued, medicine will follow. 5. Hypertension: Antihypertensives will be continued. 6. Hypercholesterolemia: His statin therapy will be continued.    Dann Ventress, Latina CraverGregory G  06/20/2015, 1:40 PM

## 2015-06-20 NOTE — Transfer of Care (Signed)
Immediate Anesthesia Transfer of Care Note  Patient: Edward Boyer  Procedure(s) Performed: Procedure(s): AMPUTATION TOE and application of wound vac (Right) IRRIGATION AND DEBRIDEMENT FOOT (Right)  Patient Location: PACU  Anesthesia Type:General  Level of Consciousness: sedated  Airway & Oxygen Therapy: Patient Spontanous Breathing and Patient connected to face mask oxygen  Post-op Assessment: Report given to RN and Post -op Vital signs reviewed and stable  Post vital signs: Reviewed and stable  Last Vitals:  Filed Vitals:   06/20/15 1217 06/20/15 1445  BP: 108/58 85/54  Pulse: 71 69  Temp: 36.7 C 36.7 C  Resp: 21 14    Last Pain:  Filed Vitals:   06/20/15 1449  PainSc: 0-No pain      Patients Stated Pain Goal: 1 (06/20/15 1000)  Complications: No apparent anesthesia complications

## 2015-06-20 NOTE — Progress Notes (Signed)
[facility] [place] [facility]   PATIENT NAME: Edward Boyer    MR#:  [id number]  DATE OF BIRTH:  1960-05-09  SUBJECTIVE:  CHIEF COMPLAINT:  No chief complaint on file.  - Admitted to vascular surgery service for right foot second toe dry gangrene and scheduled for angiogram. -Also possible infection of the second and first dose of the same foot due to diabetes and recent trauma. -MRI of the foot today and possible debridement today by podiatry. -Medical consult for medical management.  REVIEW OF SYSTEMS:  Review of Systems  Constitutional: Negative for fever, chills and malaise/fatigue.  HENT: Negative for ear discharge, ear pain and nosebleeds.   Eyes: Negative for blurred vision and double vision.  Respiratory: Negative for cough, shortness of breath and wheezing.   Cardiovascular: Negative for chest pain, palpitations and leg swelling.  Gastrointestinal: Negative for nausea, vomiting, abdominal pain, diarrhea and constipation.  Genitourinary: Negative for dysuria and urgency.  Musculoskeletal: Positive for myalgias and joint pain.  Neurological: Negative for dizziness, sensory change, speech change, focal weakness, seizures and headaches.  Psychiatric/Behavioral: Negative for depression. The patient is nervous/anxious.     DRUG ALLERGIES:   Allergies  Allergen Reactions  . Penicillins     VITALS:  Blood pressure 108/58, pulse 71, temperature 98.1 F (36.7 C), temperature source Oral, resp. rate 21, height  (1.803 m), weight 107.049 kg (236 lb), SpO2 99 %.  PHYSICAL EXAMINATION:  Physical Exam  GENERAL:  55 y.o.-year-old patient lying in the bed with no acute distress.  EYES: Pupils equal, round, reactive to light and accommodation. No scleral icterus. Extraocular muscles intact.  HEENT: Head atraumatic, normocephalic. Oropharynx and nasopharynx clear.  NECK:  Supple, no jugular venous distention. No thyroid enlargement, no  tenderness.  LUNGS: Normal breath sounds bilaterally, no wheezing, rales,rhonchi or crepitation. No use of accessory muscles of respiration. Decreased bibasilar breath sounds CARDIOVASCULAR: S1, S2 normal. No murmurs, rubs, or gallops.  ABDOMEN: Soft, nontender, nondistended. Bowel sounds present. No organomegaly or mass.  EXTREMITIES: No pedal edema, cyanosis, or clubbing.Right foot second toe has dry gangrene but fluctuant abscess is probably on plantar surface of first toe.  NEUROLOGIC: Cranial nerves II through XII are intact. Muscle strength 5/5 in all extremities. Sensation intact. Gait not checked.  PSYCHIATRIC: The patient is alert and oriented x 3.  SKIN: No obvious rash, lesion, or ulcer.    LABORATORY PANEL:   CBC  Recent Labs Lab 06/20/15 0448  WBC 12.2*  HGB 9.6*  HCT 30.4*  PLT 281   ------------------------------------------------------------------------------------------------------------------  Chemistries   Recent Labs Lab 06/19/15 1929  NA 137  K 4.6  CL 108  CO2 22  GLUCOSE 142*  BUN 21*  CREATININE 1.24  CALCIUM 8.5*  AST 16  ALT 16*  ALKPHOS 69  BILITOT 0.5   ------------------------------------------------------------------------------------------------------------------  Cardiac Enzymes No results for input(s): TROPONINI in the last 168 hours. ------------------------------------------------------------------------------------------------------------------  RADIOLOGY:  Mr Foot Right Wo Contrast  06/20/2015  CLINICAL DATA:  The patient stepped on a staple approximately 3 weeks ago resulting in a right foot wound which subsequently became infected. Diabetic patient. Gangrenous appearance of the right second toe. EXAM: MRI OF THE RIGHT FOREFOOT WITHOUT CONTRAST TECHNIQUE: Multiplanar, multisequence MR imaging was performed. No intravenous contrast was administered. COMPARISON:  Plain films of the right foot 06/19/2015. FINDINGS: Subcutaneous  edema is present over the dorsum of the foot. In the plantar soft tissues at the second web space and deep to  the first MTP joint, a fluid collection measuring 2.6 cm transverse by 1.5 cm craniocaudal by 1.4 cm long is identified worrisome for abscess. A second fluid collection is seen in the dorsal soft tissues which measures 1.8 cm transverse by approximately 2.7 cm long by 0.8 cm craniocaudal. This collection begins proximally superficial to the second metatarsal and distally along the medial aspect of the second toe. It is immediately superficial to the flexor tendons of the first toe. Finally, a small amount of fluid is seen between the heads of the third and fourth metatarsals. Marrow edema is seen in the distal 2.3 cm of the second metatarsal and in the plantar aspect of the head of the third metatarsal. Mild marrow edema is also seen in the base of the proximal phalanx of the second toe. The patient has first MTP osteoarthritis. Mild marrow edema in the medial sesamoid bone is likely related to degenerative disease. Intrinsic musculature of the foot demonstrates increased T2 signal and mild fatty replacement. No tendon tear is identified. IMPRESSION: Findings consistent with cellulitis about the right foot. Abscesses are seen along the distal second metatarsal and second toe and plantar soft tissues deep to the first MTP joint as described above. Findings consistent with osteomyelitis in the distal second metatarsal, head of the third metatarsal and base of the proximal phalanx of the second toe. Tiny amount of fluid between the heads of the third and fourth metatarsals is most consistent with bursitis, likely aseptic. Electronically Signed   By: Drusilla Kanner M.D.   On: 06/20/2015 12:14   Dg Foot 2 Views Right  06/19/2015  CLINICAL DATA:  Patient stepped on a piece of metal that went through his foot mid May. The foreign body was removed and infection set in. He has redness to the posterior ball of  his foot, the anterior foot including his toes. His 2nd digit is shrunken and black. There is visible redness and swelling to the adjacent digits. Some dried blisters/scabs. Hx diabetes, mi, cad, htn, pvd, stroke EXAM: RIGHT FOOT - 2 VIEW COMPARISON:  None. FINDINGS: No fracture.  Joints are normally spaced and aligned. No radiopaque foreign body. There are no areas of bone resorption to suggest osteomyelitis. There are prominent dorsal and plantar calcaneal spurs. IMPRESSION: 1. No fracture dislocation. 2. No radiopaque foreign body. 3. No radiographic evidence of osteomyelitis. Electronically Signed   By: Amie Portland M.D.   On: 06/19/2015 20:00    EKG:  No orders found for this or any previous visit.  ASSESSMENT AND PLAN:   56 year old male with past medical history significant for diabetes mellitus, CAD status post CABG in February 2017, hypertension, peripheral neuropathy, CK D stage III presents with right foot second toe gangrene and infection.  #1 right forefoot abscess from recent foreign body and gangrene of second toe-appreciate vascular and podiatry input -MRI of the foot revealing cellulitis and abscesses on the right side. Possible osteomyelitis and distal second metatarsal and third metatarsal and second toe phalanx. -For possible debridement and amputation of the second toe today. -Continue vancomycin. On Levaquin. Verify penicillin allergy if antibiotics needed to be broadened.  #2 Gangrene of toe- vascular following- possible angiogram scheduled early next week  #3 CAD s/p CABG in Feb 2017 at [facility]- stable for now - on asa, coreg, statin, lisinopril Has ischemic cardiomyopathy and last known EF of 30%  #4 diabetes mellitus-on 70/30 insulin 35 units in the morning and 15 units at bedtime. Also on metformin. -Currently only  on sliding scale insulin as he is nothing by mouth -Monitor after the procedure and restart medications  #5 CK D stage III-stable at this time monitor on  antibiotics  #6 DVT prophylaxis-on Lovenox    All the records are reviewed and case discussed with Care Management/Social Workerr. Management plans discussed with the patient, family and they are in agreement.  CODE STATUS: Full code  TOTAL TIME TAKING CARE OF THIS PATIENT: 38 minutes.   POSSIBLE D/C IN 2-3 DAYS, DEPENDING ON CLINICAL CONDITION.   Enid BaasKALISETTI,Ariam Mol M.D on 06/20/2015 at 12:19 PM  Between 7am to 6pm - Pager - [phone]  After 6pm go to [email or web address] - password [email or web address] [email or web address]  [facility]  Office  [phone]  CC: Primary care physician; No PCP Per Patient

## 2015-06-20 NOTE — Anesthesia Preprocedure Evaluation (Signed)
Anesthesia Evaluation  Patient identified by MRN, date of birth, ID band Patient awake    Reviewed: Allergy & Precautions, H&P , NPO status , Patient's Chart, lab work & pertinent test results, reviewed documented beta blocker date and time   History of Anesthesia Complications Negative for: history of anesthetic complications  Airway Mallampati: III  TM Distance: >3 FB Neck ROM: full    Dental no notable dental hx. (+) Teeth Intact   Pulmonary    Pulmonary exam normal breath sounds clear to auscultation       Cardiovascular Exercise Tolerance: Good hypertension, On Medications and On Home Beta Blockers (-) angina+ CAD, + Past MI, + CABG and + Peripheral Vascular Disease  (-) Cardiac Stents Normal cardiovascular exam(-) dysrhythmias (-) Valvular Problems/Murmurs Rhythm:regular Rate:Normal     Neuro/Psych neg Seizures TIA Neuromuscular disease (peripheral neuropathy) negative neurological ROS  negative psych ROS   GI/Hepatic negative GI ROS, Neg liver ROS,   Endo/Other  diabetes, Well Controlled, Insulin Dependent  Renal/GU negative Renal ROS  negative genitourinary   Musculoskeletal   Abdominal   Peds  Hematology negative hematology ROS (+)   Anesthesia Other Findings Past Medical History:   Diabetes mellitus without complication (HCC)                 Myocardial infarction (HCC)                                  Coronary artery disease                                      Hypertension                                                 Peripheral vascular disease (HCC)                            Stroke (HCC)                                                 Reproductive/Obstetrics negative OB ROS                             Anesthesia Physical Anesthesia Plan  ASA: III  Anesthesia Plan: General   Post-op Pain Management:    Induction:   Airway Management Planned:   Additional  Equipment:   Intra-op Plan:   Post-operative Plan:   Informed Consent: I have reviewed the patients History and Physical, chart, labs and discussed the procedure including the risks, benefits and alternatives for the proposed anesthesia with the patient or authorized representative who has indicated his/her understanding and acceptance.   Dental Advisory Given  Plan Discussed with: Anesthesiologist, CRNA and Surgeon  Anesthesia Plan Comments:         Anesthesia Quick Evaluation

## 2015-06-20 NOTE — Op Note (Addendum)
Operative note   Surgeon:Deshea Pooley    Assistant:none    Preop diagnosis:1Osteomyelitis right 2nd toe and metatarsal head 2.osteomyelitis right third metatarsal head 3.abscess plantar right great toe    Postop diagnosis:Same    Procedure:1.  Amputation right 2nd toe mtpj and excision 2nd metatarsal head  2.  Excsion metatarsal head right 3rd metatarsal   3. Incision and drainage abscess right 1st mtpj    WUJ:[id number]:Minimal    Anesthesia:general    Hemostasis:none    Specimen: Amputated right second toe and metatarsal head right third metatarsal    Complications: None    Operative indications:Edward Boyer is an 55 y.o. that presents today for surgical intervention.  The risks/benefits/alternatives/complications have been discussed and consent has been given.    Procedure:  Patient was brought into the OR and placed on the operating table in thesupine position. After anesthesia was obtained theright lower extremity was prepped and draped in usual sterile fashion.  Attention was directed to the second toe where the entire second digit was completely gangrenous. A circumferential incision was made surrounding the second MTPJ to the plantar aspect of the foot. Noted purulent abscess was found around the second metatarsophalangeal joint. The incision was taken back slightly to the head of the metatarsal. Further dissection was carried out deep with further signs of purulent abscess. Incision was then furthered along the plantar aspect of the first metatarsophalangeal joint where noted necrotic and purulent drainage was noted plantar to the first metatarsal phalangeal joint. This area was initially drained and flushed with copious amount of fluid. At this time the second metatarsophalangeal joint and second metatarsal head were removed with a power saw. This was taken from the surgical field in toto. MRI revealed osteoarthritis of the third metatarsal head the plantar aspect. At this time  the third metatarsal head was dissected out and at the surgical neck excised with a power saw. Further irrigation of the entire area was then performed. The plantar aspect of the first metatarsal phalangeal joint was then excisionally debrided with a versa jet through the subcutaneous tissue into the muscle tissue region. All wounds were finally copiously irrigated. The dorsal incision on the metatarsal head was closed with a single 2-0 nylon stitch. The wound was packed with vancomycin impregnated calcium sulfate beads. A silver impregnated wound VAC dressing was placed into the surgical site and negative pressure therapy was then applied. A compressive sterile bulky dressing was then applied to the right foot.  Intraoperative findings were consistent with marketed abscess around the second metatarsophalangeal joint extending to the third metatarsal head and plantar aspect of the first metatarsal. Necrotic skin of the plantar great toe was excised. Of concern is the great toe as it had a dusky and very firm appearance and presentation. The third toe fourth toe and fifth toes all seemed to have good capillary fill time and were soft and supple as expected.    Patient tolerated the procedure and anesthesia well.  Was transported from the OR to the PACU with all vital signs stable and vascular status intact.

## 2015-06-20 NOTE — Anesthesia Postprocedure Evaluation (Signed)
Anesthesia Post Note  Patient: Edward Boyer  Procedure(s) Performed: Procedure(s) (LRB): AMPUTATION TOE and application of wound vac (Right) IRRIGATION AND DEBRIDEMENT FOOT (Right)  Patient location during evaluation: PACU Anesthesia Type: General Level of consciousness: awake and alert Pain management: pain level controlled Vital Signs Assessment: post-procedure vital signs reviewed and stable Respiratory status: spontaneous breathing, nonlabored ventilation, respiratory function stable and patient connected to nasal cannula oxygen Cardiovascular status: blood pressure returned to baseline and stable Postop Assessment: no signs of nausea or vomiting Anesthetic complications: no    Last Vitals:  Filed Vitals:   06/20/15 1735 06/20/15 1738  BP: 90/58 96/58  Pulse:    Temp:    Resp:      Last Pain:  Filed Vitals:   06/20/15 1738  PainSc: 0-No pain                 Lenard SimmerAndrew La Shehan

## 2015-06-20 NOTE — Anesthesia Procedure Notes (Signed)
Procedure Name: LMA Insertion Date/Time: 06/20/2015 1:45 PM Performed by: Ginger CarneMICHELET, Feliciana Narayan Pre-anesthesia Checklist: Patient identified, Emergency Drugs available, Suction available, Patient being monitored and Timeout performed Patient Re-evaluated:Patient Re-evaluated prior to inductionOxygen Delivery Method: Circle system utilized Preoxygenation: Pre-oxygenation with 100% oxygen Intubation Type: IV induction LMA: LMA inserted LMA Size: 4.5 Tube type: Oral Number of attempts: 1 Placement Confirmation: positive ETCO2 and breath sounds checked- equal and bilateral Tube secured with: Tape Dental Injury: Teeth and Oropharynx as per pre-operative assessment

## 2015-06-20 NOTE — Consult Note (Signed)
Reason for Consult: Medical management/pre-op clearance Referring Physician: Delana Meyer, MD  Edward Boyer is an 55 y.o. male.  HPI: The patient was admitted for infection of the right second toe. He recently underwent removal of a metal shard from the base of the digit. The patient did not feel the injury due to diabetic neuropathy. The initial surgery was without complication but he has subsequently developed necrosis of the toe with drainage from the base of the digit. Incision and drainage was performed by podiatry. Wound culture was obtained and the patient is scheduled for MRI today to determine the extent of osteomyelitis. Vascular surgery is planned in a few days for revascularization of the patient's distal extremity. He is in no pain. He denies fever, nausea, vomiting or diarrhea. He also denies shortness of breath. The internal medicine service was consulted for perioperative risk evaluation.  Past Medical History  Diagnosis Date  . Diabetes mellitus without complication ([place])   . Myocardial infarction ([place])   . Coronary artery disease   . Hypertension   . Peripheral vascular disease ([place])   . Stroke [facility])     Past Surgical History  Procedure Laterality Date  . Coronary artery bypass graft      Family History  Problem Relation Age of Onset  . CAD Father   . Diabetes Mellitus II Father     Social History:  reports that he has never smoked. He does not have any smokeless tobacco history on file. His alcohol and drug histories are not on file.  Allergies:  Allergies  Allergen Reactions  . Penicillins     Medications:  I have reviewed the patient's current medications. Prior to Admission:  Prescriptions prior to admission  Medication Sig Dispense Refill Last Dose  . acidophilus (RISAQUAD) CAPS capsule Take by mouth 2 (two) times daily.     Marland Kitchen aspirin 81 MG tablet Take 81 mg by mouth daily.     Marland Kitchen atorvastatin (LIPITOR) 80 MG tablet Take 80 mg by mouth at bedtime.      . carvedilol (COREG) 25 MG tablet Take 25 mg by mouth 2 (two) times daily with a meal.     . clopidogrel (PLAVIX) 75 MG tablet Take 75 mg by mouth daily.     . insulin NPH-regular Human (NOVOLIN 70/30) (70-30) 100 UNIT/ML injection Inject 30 Units into the skin daily with breakfast.     . insulin NPH-regular Human (NOVOLIN 70/30) (70-30) 100 UNIT/ML injection Inject 10 Units into the skin daily with supper.     Marland Kitchen lisinopril (PRINIVIL,ZESTRIL) 2.5 MG tablet Take 2.5 mg by mouth daily.     . metFORMIN (GLUCOPHAGE) 1000 MG tablet Take 1,000 mg by mouth daily before supper.     . nitroGLYCERIN (NITRODUR - DOSED IN MG/24 HR) 0.4 mg/hr patch Place 0.4 mg under the tongue every 5 (five) minutes x 3 doses as needed.     Marland Kitchen oxyCODONE-acetaminophen (PERCOCET/ROXICET) 5-325 MG tablet Take 1 tablet by mouth every 4 (four) hours as needed for severe pain.      Scheduled: . aspirin EC  81 mg Oral Daily  . atorvastatin  80 mg Oral q1800  . carvedilol  25 mg Oral BID WC  . chlorhexidine  60 mL Topical Once  . clindamycin (CLEOCIN) IV  900 mg Intravenous On Call to OR  . docusate sodium  100 mg Oral BID  . enoxaparin (LOVENOX) injection  40 mg Subcutaneous Q24H  . insulin aspart  0-15 Units Subcutaneous TID  WC  . levofloxacin (LEVAQUIN) IV  750 mg Intravenous Q24H  . lisinopril  2.5 mg Oral Daily  . povidone-iodine  2 application Topical Once  . vancomycin  1,500 mg Intravenous Q24H   Continuous:   Results for orders placed or performed during the hospital encounter of 06/19/15 (from the past 48 hour(s))  CBC     Status: Abnormal   Collection Time: 06/19/15  7:29 PM  Result Value Ref Range   WBC 12.7 (H) 3.8 - 10.6 K/uL   RBC 4.09 (L) 4.40 - 5.90 MIL/uL   Hemoglobin 10.1 (L) 13.0 - 18.0 g/dL   HCT 32.0 (L) 40.0 - 52.0 %   MCV 78.1 (L) 80.0 - 100.0 fL   MCH 24.6 (L) 26.0 - 34.0 pg   MCHC 31.5 (L) 32.0 - 36.0 g/dL   RDW 17.9 (H) 11.5 - 14.5 %   Platelets 273 150 - 440 K/uL  Comprehensive  metabolic panel     Status: Abnormal   Collection Time: 06/19/15  7:29 PM  Result Value Ref Range   Sodium 137 135 - 145 mmol/L   Potassium 4.6 3.5 - 5.1 mmol/L   Chloride 108 101 - 111 mmol/L   CO2 22 22 - 32 mmol/L   Glucose, Bld 142 (H) 65 - 99 mg/dL   BUN 21 (H) 6 - 20 mg/dL   Creatinine, Ser 1.24 0.61 - 1.24 mg/dL   Calcium 8.5 (L) 8.9 - 10.3 mg/dL   Total Protein 7.4 6.5 - 8.1 g/dL   Albumin 2.9 (L) 3.5 - 5.0 g/dL   AST 16 15 - 41 U/L   ALT 16 (L) 17 - 63 U/L   Alkaline Phosphatase 69 38 - 126 U/L   Total Bilirubin 0.5 0.3 - 1.2 mg/dL   GFR calc non Af Amer >60 >60 mL/min   GFR calc Af Amer >60 >60 mL/min    Comment: (NOTE) The eGFR has been calculated using the CKD EPI equation. This calculation has not been validated in all clinical situations. eGFR's persistently <60 mL/min signify possible Chronic Kidney Disease.    Anion gap 7 5 - 15  Vancomycin, random     Status: None   Collection Time: 06/19/15  7:35 PM  Result Value Ref Range   Vancomycin Rm 12 ug/mL    Comment:        Random Vancomycin therapeutic range is dependent on dosage and time of specimen collection. A peak range is 20.0-40.0 ug/mL A trough range is 5.0-15.0 ug/mL          Glucose, capillary     Status: Abnormal   Collection Time: 06/19/15  9:10 PM  Result Value Ref Range   Glucose-Capillary 172 (H) 65 - 99 mg/dL    Dg Foot 2 Views Right  06/19/2015  CLINICAL DATA:  Patient stepped on a piece of metal that went through his foot mid May. The foreign body was removed and infection set in. He has redness to the posterior ball of his foot, the anterior foot including his toes. His 2nd digit is shrunken and black. There is visible redness and swelling to the adjacent digits. Some dried blisters/scabs. Hx diabetes, mi, cad, htn, pvd, stroke EXAM: RIGHT FOOT - 2 VIEW COMPARISON:  None. FINDINGS: No fracture.  Joints are normally spaced and aligned. No radiopaque foreign body. There are no areas of bone  resorption to suggest osteomyelitis. There are prominent dorsal and plantar calcaneal spurs. IMPRESSION: 1. No fracture dislocation. 2. No radiopaque foreign  body. 3. No radiographic evidence of osteomyelitis. Electronically Signed   By: Lajean Manes M.D.   On: 06/19/2015 20:00    Review of Systems  Constitutional: Negative for fever and chills.  HENT: Negative for sore throat and tinnitus.   Eyes: Negative for blurred vision and redness.  Respiratory: Negative for cough and shortness of breath.   Cardiovascular: Negative for chest pain, palpitations, orthopnea and PND.  Gastrointestinal: Negative for nausea, vomiting, abdominal pain and diarrhea.  Genitourinary: Negative for dysuria, urgency and frequency.  Musculoskeletal: Negative for myalgias and joint pain.  Skin: Negative for rash.       No lesions  Neurological: Negative for speech change, focal weakness and weakness.  Endo/Heme/Allergies: Does not bruise/bleed easily.       No temperature intolerance  Psychiatric/Behavioral: Negative for depression and suicidal ideas.   Blood pressure 120/58, pulse 89, temperature 99.6 F (37.6 C), temperature source Oral, resp. rate 18, height 5' 11"  (1.803 m), weight 107.049 kg (236 lb), SpO2 98 %. Physical Exam  Constitutional: He is oriented to person, place, and time. He appears well-developed and well-nourished. No distress.  HENT:  Head: Normocephalic and atraumatic.  Mouth/Throat: Oropharynx is clear and moist.  Eyes: Conjunctivae and EOM are normal. Pupils are equal, round, and reactive to light.  Neck: Normal range of motion. Neck supple. No JVD present. No tracheal deviation present. No thyromegaly present.  Cardiovascular: Normal rate and regular rhythm.  Exam reveals no gallop and no friction rub.   No murmur heard. Respiratory: Effort normal and breath sounds normal. No respiratory distress.  GI: Soft. Bowel sounds are normal. He exhibits no distension. There is no tenderness.   Genitourinary:  Deferred  Musculoskeletal: Normal range of motion. He exhibits no edema.  2nd digit right foot dry and necrotic; flexor tendon exposed; non-painful  Lymphadenopathy:    He has no cervical adenopathy.  Neurological: He is alert and oriented to person, place, and time. No cranial nerve deficit.  Skin: Skin is warm and dry. No rash noted. No erythema.  Psychiatric: He has a normal mood and affect. His behavior is normal. Judgment and thought content normal.    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 1. Gangrenous digit: Likely associated with osteomyelitis. MRI scheduled. Initial amputation of the digit will be extremely low risk surgery. Planned revascularization to follow is also relatively low risk for non-cardiothoracic surgery. The patient has significant comorbidities that are all reasonably controlled at this time. (The patient is status post CABG February 2017). Lyndel Safe perioperative risk score 0.51%. 2. CAD: Stable; continue aspirin 3. Diabetes mellitus type 2: Continue sliding scale insulin. Check hemoglobin A1c. 4. Essential hypertension: Continue carvedilol 5. Hyperlipidemia: Continue statin therapy 6. DVT prophylaxis: Lovenox; hold prior to surgery per vascular surgery recommendations Tachycardia for involving [place] in the care of this patient we will continue to follow if needed.  Harrie Foreman 06/20/2015, 12:49 AM

## 2015-06-21 ENCOUNTER — Encounter: Payer: Self-pay | Admitting: Podiatry

## 2015-06-21 ENCOUNTER — Encounter
Admission: AD | Disposition: A | Discharge status: Skilled Nursing Facility | Payer: Self-pay | Source: Other Acute Inpatient Hospital | Attending: Vascular Surgery

## 2015-06-21 HISTORY — PX: PERIPHERAL VASCULAR CATHETERIZATION: SHX172C

## 2015-06-21 LAB — BASIC METABOLIC PANEL
Anion gap: 8 (ref 5–15)
BUN: 20 mg/dL (ref 6–20)
CO2: 21 mmol/L — ABNORMAL LOW (ref 22–32)
Calcium: 8.2 mg/dL — ABNORMAL LOW (ref 8.9–10.3)
Chloride: 109 mmol/L (ref 101–111)
Creatinine, Ser: 1.45 mg/dL — ABNORMAL HIGH (ref 0.61–1.24)
GFR calc Af Amer: >60 mL/min (ref >60)
GFR calc non Af Amer: 54 mL/min — ABNORMAL LOW (ref >60)
Glucose, Bld: 102 mg/dL — ABNORMAL HIGH (ref 65–99)
Potassium: 4.4 mmol/L (ref 3.5–5.1)
Sodium: 138 mmol/L (ref 135–145)

## 2015-06-21 LAB — CBC
HCT: 27.3 % — ABNORMAL LOW (ref 40.0–52.0)
Hemoglobin: 8.8 g/dL — ABNORMAL LOW (ref 13.0–18.0)
MCH: 25 pg — ABNORMAL LOW (ref 26.0–34.0)
MCHC: 32.3 g/dL (ref 32.0–36.0)
MCV: 77.3 fL — ABNORMAL LOW (ref 80.0–100.0)
Platelets: 244 10*3/uL (ref 150–440)
RBC: 3.53 MIL/uL — ABNORMAL LOW (ref 4.40–5.90)
RDW: 17.8 % — ABNORMAL HIGH (ref 11.5–14.5)
WBC: 11 10*3/uL — ABNORMAL HIGH (ref 3.8–10.6)

## 2015-06-21 LAB — GLUCOSE, CAPILLARY
GLUCOSE-CAPILLARY: 79 mg/dL (ref 65–99)
Glucose-Capillary: 128 mg/dL — ABNORMAL HIGH (ref 65–99)
Glucose-Capillary: 82 mg/dL (ref 65–99)
Glucose-Capillary: 100 mg/dL — ABNORMAL HIGH (ref 65–99)

## 2015-06-21 LAB — VANCOMYCIN, TROUGH: Vancomycin Tr: 13 ug/mL (ref 10–20)

## 2015-06-21 SURGERY — ABDOMINAL AORTOGRAM W/LOWER EXTREMITY
Anesthesia: IV Sedation (MBSC Only) | Laterality: Right

## 2015-06-21 MED ORDER — IOPAMIDOL (ISOVUE-300) INJECTION 61%
INTRAVENOUS | Status: DC | PRN
  Administered 2015-06-21: 70 mL via INTRA_ARTERIAL

## 2015-06-21 MED ORDER — MIDAZOLAM HCL 2 MG/2ML IJ SOLN
INTRAMUSCULAR | Status: DC | PRN
  Administered 2015-06-21 (×4): 1 mg via INTRAVENOUS
  Administered 2015-06-21: 2 mg via INTRAVENOUS

## 2015-06-21 MED ORDER — FENTANYL CITRATE (PF) 100 MCG/2ML IJ SOLN
INTRAMUSCULAR | Status: AC
Start: 2015-06-21 — End: 2015-06-21
  Filled 2015-06-21: qty 2

## 2015-06-21 MED ORDER — CLINDAMYCIN PHOSPHATE 300 MG/50ML IV SOLN
INTRAVENOUS | Status: AC
  Administered 2015-06-21: 18:00:00
  Filled 2015-06-21: qty 50

## 2015-06-21 MED ORDER — CLOPIDOGREL BISULFATE 75 MG PO TABS
75.0000 mg | ORAL_TABLET | Freq: Every day | ORAL | Status: DC
  Administered 2015-06-22 – 2015-06-28 (×5): 75 mg via ORAL
  Filled 2015-06-21 (×6): qty 1

## 2015-06-21 MED ORDER — CLOPIDOGREL BISULFATE 75 MG PO TABS
ORAL_TABLET | ORAL | Status: AC
  Filled 2015-06-21: qty 2

## 2015-06-21 MED ORDER — FENTANYL CITRATE (PF) 100 MCG/2ML IJ SOLN
INTRAMUSCULAR | Status: AC
  Filled 2015-06-21: qty 2

## 2015-06-21 MED ORDER — VANCOMYCIN HCL 10 G IV SOLR
1500.0000 mg | INTRAVENOUS | Status: DC
  Administered 2015-06-21 – 2015-06-23 (×2): 1500 mg via INTRAVENOUS
  Filled 2015-06-21 (×3): qty 1500

## 2015-06-21 MED ORDER — HEPARIN (PORCINE) IN NACL 2-0.9 UNIT/ML-% IJ SOLN
INTRAMUSCULAR | Status: AC
  Filled 2015-06-21: qty 1000

## 2015-06-21 MED ORDER — HEPARIN SODIUM (PORCINE) 1000 UNIT/ML IJ SOLN
INTRAMUSCULAR | Status: DC | PRN
  Administered 2015-06-21: 5000 [IU] via INTRAVENOUS

## 2015-06-21 MED ORDER — HEPARIN SODIUM (PORCINE) 1000 UNIT/ML IJ SOLN
INTRAMUSCULAR | Status: AC
  Filled 2015-06-21: qty 1

## 2015-06-21 MED ORDER — FENTANYL CITRATE (PF) 100 MCG/2ML IJ SOLN
INTRAMUSCULAR | Status: DC | PRN
  Administered 2015-06-21 (×2): 25 ug via INTRAVENOUS
  Administered 2015-06-21 (×4): 50 ug via INTRAVENOUS

## 2015-06-21 MED ORDER — LIDOCAINE HCL (PF) 1 % IJ SOLN
INTRAMUSCULAR | Status: AC
  Filled 2015-06-21: qty 30

## 2015-06-21 MED ORDER — MIDAZOLAM HCL 5 MG/5ML IJ SOLN
INTRAMUSCULAR | Status: AC
  Filled 2015-06-21: qty 5

## 2015-06-21 MED ORDER — SODIUM CHLORIDE 0.9 % IV SOLN
INTRAVENOUS | Status: DC
  Administered 2015-06-21 – 2015-06-25 (×8): via INTRAVENOUS

## 2015-06-21 MED ORDER — CLOPIDOGREL BISULFATE 75 MG PO TABS
150.0000 mg | ORAL_TABLET | ORAL | Status: AC
  Administered 2015-06-21: 150 mg via ORAL

## 2015-06-21 MED ORDER — MIDAZOLAM HCL 2 MG/2ML IJ SOLN
INTRAMUSCULAR | Status: AC
  Filled 2015-06-21: qty 2

## 2015-06-21 SURGICAL SUPPLY — 20 items
BALLN ULTRVRSE 018 2.5X100X150 (BALLOONS) ×3
BALLN ULTRVRSE 3X100X130C (BALLOONS) ×3
BALLOON ULTRVRSE 3X100X130C (BALLOONS) ×1 IMPLANT
BALLOON ULTRVS 018 2.5X100X150 (BALLOONS) ×1 IMPLANT
CATH G STR 4FX120.038 (CATHETERS) ×3 IMPLANT
CATH PIG 70CM (CATHETERS) ×3 IMPLANT
DEVICE PRESTO INFLATION (MISCELLANEOUS) ×3 IMPLANT
DEVICE STARCLOSE SE CLOSURE (Vascular Products) ×3 IMPLANT
DEVICE TORQUE (MISCELLANEOUS) ×6 IMPLANT
GLIDEWIRE ADV .035X260CM (WIRE) ×3 IMPLANT
GLIDEWIRE ANGLED SS 035X260CM (WIRE) ×6 IMPLANT
PACK ANGIOGRAPHY (CUSTOM PROCEDURE TRAY) ×3 IMPLANT
SET INTRO CAPELLA COAXIAL (SET/KITS/TRAYS/PACK) ×3 IMPLANT
SHEATH BRITE TIP 5FRX11 (SHEATH) ×3 IMPLANT
SHEATH PINNACLE ST 6F 45CM (SHEATH) ×3 IMPLANT
SHIELD RADPAD SCOOP 12X17 (MISCELLANEOUS) ×3 IMPLANT
SYR MEDRAD MARK V 150ML (SYRINGE) ×3 IMPLANT
TUBING CONTRAST HIGH PRESS 72 (TUBING) ×3 IMPLANT
WIRE G.018X260 SHT (WIRE) ×3 IMPLANT
WIRE J 3MM .035X145CM (WIRE) ×3 IMPLANT

## 2015-06-21 NOTE — Consult Note (Addendum)
Pharmacy Antibiotic Note  Edward Boyer is a 55 y.o. male admitted on 06/19/2015 with diabetic foot infection.  Pharmacy has been consulted for vancomycin and levofloxacin dosing.  [facility] [place] and spoke with Barnes & NobleKevin Rph. States pt received 1750mg  of vancomycin @ 1400 on 6/16. His scr was 1.35 and weight was 104kg- adjusted weight crcl is 678ml/min. Pt transferred prior to getting a second dose of vancomycin. Pt was on levofloxacin and clindamycin at home.  It has been 29.5 hours since last dose of vancomycin. Random level resulted at 12.    Plan: Will give vancomycin 1500mg  q 24 hours  Based on a caculated t1/2 of 18.7hr vd=74.9 and ke=0.037 Due to pt size he is at risk for accumulation. Monitor closely. Will draw trough at steady state 6/20 2030  6/19: Dosing calculations based on CrCl and pt weight suggest that vanc trough would be <15 on vancomycin 1500 mg IV q24. Will order trough for tonight at 2030 to see where we are (not yet Css, before 4th overall dose).   Levofloxacin 750mg  q 24 hours   Height: 5\' 11"  (180.3 cm) Weight: 236 lb (107.049 kg) IBW/kg (Calculated) : 75.3  Temp (24hrs), Avg:98.2 F (36.8 C), Min:98 F (36.7 C), Max:99.4 F (37.4 C)   Recent Labs Lab 06/19/15 1929 06/19/15 1935 06/20/15 0448 06/21/15 0424  WBC 12.7*  --  12.2* 11.0*  CREATININE 1.24  --   --  1.45*  VANCORANDOM  --  12  --   --     Estimated Creatinine Clearance: 73.3 mL/min (by C-G formula based on Cr of 1.45).    Allergies  Allergen Reactions  . Penicillins     Antimicrobials this admission: vancomycin 6/17 >>  levofloxacin 6/17 >>   Dose adjustments this admission:   Microbiology results:   Thank you for allowing pharmacy to be a part of this patient's care.  Crist FatHannah Ehsan Corvin, PharmD, BCPS Clinical Pharmacist 06/21/2015 1:14 PM

## 2015-06-21 NOTE — Evaluation (Signed)
Physical Therapy Evaluation Patient Details Name: Edward DessMartin Luther Jakubiak Boyer MRN: [id number] DOB: 08/22/1960 Today's Date: 06/21/2015   History of Present Illness  Pt is a 55 y.o. male presenting to hospital with gangrene R 2nd toe (recent puncture wound with foreign body nail) and [facility]  Pt admitted with atherosclerotic occlusive disease B LE's associated with gangrene R 2nd toe.  Pt s/p R 2nd toe and metatarsal head and R 3rd metatarsal head amputation d/t osteomyelitis and also debridement d/t abscess R great toe 06/19/17 with wound vac placement.  Plan for angiography 06/21/15.  PMH includes DM, MI, CAD, htn, PVD, stroke, CABG (in February).  Clinical Impression  Prior to admission, pt was independent ambulating without AD.  Pt lives with his mother on main level of home with 1-2 stairs to enter.  Currently pt is min to mod assist supine to sit, min assist to stand, and min assist to ambulate a few feet bed to recliner with use of RW.  Pt did well maintaining R LE NWB'ing precautions with minimal vc's.  No c/o R foot pain during session but pt did report R wrist pain d/t IV site location with use of RW (nursing notified).  Pt would benefit from skilled PT to address noted impairments and functional limitations.  Recommend pt discharge to STR when medically appropriate.     Follow Up Recommendations SNF    Equipment Recommendations   (pt already owns RW)    Recommendations for Other Services       Precautions / Restrictions Precautions Precautions: Fall Precaution Comments: R foot wound vac; B space boots Restrictions Weight Bearing Restrictions: Yes RLE Weight Bearing: Non weight bearing      Mobility  Bed Mobility Overal bed mobility: Needs Assistance Bed Mobility: Supine to Sit     Supine to sit: Min assist;Mod assist;HOB elevated     General bed mobility comments: assist for trunk; increased time to perform  Transfers Overall transfer level:  Needs assistance Equipment used: Rolling walker (2 wheeled) Transfers: Sit to/from Stand Sit to Stand: Min assist         General transfer comment: vc's and visual demo required for transfer technique (scooting forward on sitting surface, foot positioning, NWB'ing status, UE placement)  Ambulation/Gait Ambulation/Gait assistance: Min assist Ambulation Distance (Feet): 3 Feet (bed to recliner) Assistive device: Rolling walker (2 wheeled)   Gait velocity: decreased   General Gait Details: pt had difficulty clearing L LE from floor to advance L LE; able to maintain NWB'ing R LE with minimal vc's; limited d/t c/o R wrist pain from IV; vc's required for technique  Stairs            Wheelchair Mobility    Modified Rankin (Stroke Patients Only)       Balance Overall balance assessment: Needs assistance Sitting-balance support: Bilateral upper extremity supported;Feet unsupported Sitting balance-Leahy Scale: Good     Standing balance support: Bilateral upper extremity supported (on RW) Standing balance-Leahy Scale: Fair                               Pertinent Vitals/Pain Pain Assessment: 0-10 Pain Score: 4  Pain Location: R wrist (IV site) with use of walker Pain Descriptors / Indicators: Tender (pain only with use of RW) Pain Intervention(s): Limited activity within patient's tolerance;Monitored during session;Repositioned  Vitals stable and WFL throughout treatment session.    Home Living Family/patient expects to be discharged  to:: Private residence Living Arrangements: Parent (Mom) Available Help at Discharge: Family Type of Home: House Home Access: Stairs to enter Entrance Stairs-Rails: None Entrance Stairs-Number of Steps: 1 vs 2 Home Layout: One level (does not go to basement) Home Equipment: Walker - 2 wheels;Bedside commode;Grab bars - toilet;Grab bars - tub/shower      Prior Function Level of Independence: Independent          Comments: Pt was using RW a couple weeks ago after initial foot injury but progressed back to no AD.  Pt denies any falls in past 6 months.     Hand Dominance        Extremity/Trunk Assessment   Upper Extremity Assessment: Overall WFL for tasks assessed           Lower Extremity Assessment: RLE deficits/detail;LLE deficits/detail RLE Deficits / Details: R hip flexion and knee flexion/extension at least 3/5; deferred R ankle movement d/t wound vac LLE Deficits / Details: L LE strength and ROM WFL     Communication   Communication: No difficulties  Cognition Arousal/Alertness: Awake/alert Behavior During Therapy: WFL for tasks assessed/performed Overall Cognitive Status: Within Functional Limits for tasks assessed                      General Comments General comments (skin integrity, edema, etc.): Pt c/o R wrist pain with use of RW/during transfer so deferred further mobility than to chair (nursing notified of pt's c/o pain and very small amount of blood noted around wrist IV site post transfer).  Nursing cleared pt for participation in physical therapy; pt's wound vac started beeping shortly after PT arriving (prior to PT moving R LE) and nursing came and addressed wound vac.  Pt agreeable to PT session.  Pt's mother present during session.     Exercises Total Joint Exercises Ankle Circles/Pumps: AROM;Strengthening;Left;10 reps;Supine Quad Sets: AROM;Strengthening;Both;10 reps;Supine Short Arc Quad: AROM;Strengthening;Left;10 reps;Supine Heel Slides: AROM;Strengthening;Left;10 reps;Supine Hip ABduction/ADduction: AROM;Strengthening;Left;10 reps;Supine      Assessment/Plan    PT Assessment Patient needs continued PT services  PT Diagnosis Difficulty walking;Acute pain   PT Problem List Decreased activity tolerance;Decreased balance;Decreased mobility;Decreased knowledge of use of DME;Decreased knowledge of precautions;Pain  PT Treatment Interventions DME  instruction;Gait training;Stair training;Functional mobility training;Therapeutic activities;Therapeutic exercise;Balance training;Patient/family education;Wheelchair mobility training   PT Goals (Current goals can be found in the Care Plan section) Acute Rehab PT Goals Patient Stated Goal: to be able to walk further PT Goal Formulation: With patient Time For Goal Achievement: 07/05/15 Potential to Achieve Goals: Good    Frequency 7X/week   Barriers to discharge Decreased caregiver support      Co-evaluation               End of Session Equipment Utilized During Treatment: Gait belt Activity Tolerance: Patient limited by pain Patient left: in chair;with call bell/phone within reach;with chair alarm set;with family/visitor present (Space boots donned; R LE elevated on 2 pillows; wound vac intact and battery source plugged in) Nurse Communication: Mobility status;Precautions;Weight bearing status         Time: 0865-7846 PT Time Calculation (min) (ACUTE ONLY): 46 min   Charges:   PT Evaluation $PT Eval Moderate Complexity: 1 Procedure PT Treatments $Therapeutic Exercise: 8-22 mins   PT G CodesHendricks Boyer 2015/07/15, 12:06 PM Edward Boyer, PT [phone]

## 2015-06-21 NOTE — Op Note (Signed)
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 Introduction catheter into right lower extremity 3rd order catheter placement  2. Contrast injection right lower extremity for distal runoff  3. Percutaneous transluminal angioplasty to 3 mm right peroneal  4. Star close closure left common femoral arteriotomy  Anesthesia: Conscious sedation was administered under my direct supervision. IV Versed plus fentanyl were utilized. Continuous ECG, pulse oximetry and blood pressure was monitored throughout the entire procedure.  Versed and fentanyl were utilized. Conscious sedation was for a total of 2 hour 20 minutes.  Sheath: 6 French Pinnacle sheath  Contrast: 70 cc  Fluoroscopy Time: 13.7 minutes  Indications: Edward Boyer presents with increasing pain and gangrene of the right lower extremity. Patient has undergone resection of his second toe with resection of the metatarsal head as well as debridement of the metatarsal of the third ray. This suggests the patient is having limb threatening ischemia. The risks and benefits are reviewed all questions answered patient agrees to proceed.  Procedure:Edward Boyer is a 55 y.o. y.o. male who was identified and appropriate procedural time out was performed. The patient was then placed supine on the table and prepped and draped in the usual sterile fashion.   Ultrasound was placed in the sterile sleeve and the left groin was evaluated the left common femoral artery was echolucent and pulsatile indicating patency. Image was recorded for the permanent record and under real-time visualization  a microneedle was inserted into the common femoral artery microwire followed by a micro-sheath. A J-wire was then advanced through the micro-sheath and a 5 [place] sheath was then inserted over a J-wire. J-wire was then advanced and a 5 French pigtail catheter was positioned at the level of T12.  AP projection of the aorta was then obtained. Pigtail catheter was repositioned to above the bifurcation and a LAO view of the pelvis was obtained. Subsequently a pigtail catheter with the stiff angle Glidewire was used to cross the aortic bifurcation the catheter wire were advanced down into the right distal external iliac artery. Oblique view of the femoral bifurcation was then obtained and subsequently the wire was reintroduced and the pigtail catheter negotiated into the SFA representing third order catheter placement. Distal runoff was then performed.  5000 units of heparin was then given and allowed to circulate and a 6 [place] Pinnacle sheath was advanced up and over the bifurcation and positioned in the superficial femoral artery  Straight catheter and stiff angle Glidewire were then negotiated down into the distal popliteal. Catheter was then advanced. Hand injection contrast demonstrated the tibial anatomy in detail.  Initially a 2.5 mm x 10 cm balloon was utilized across the occluded segment of peroneal artery. The inflation was to 12 atm for 2 minutes. Follow-up imaging demonstrated persistent stricture within the proximal peroneal and a 3 x 10 balloon was used to angioplasty the proximal one third of the peroneal artery. Each inflation was for 2 minutes at 12 atm. Follow-up imaging demonstrated excellent patency with preservation of the distal runoff.  Follow-up imaging demonstrated patency with excellent result. Distal runoff was then reassessed and noted to be widely patent.   After review of these images the sheath is pulled into the left external iliac oblique of the common femoral is obtained and  a Star close device deployed. There no immediate Complications.  Findings: The abdominal aorta is opacified with a bolus injection contrast. Renal arteries are single and widely patent. The aorta itself has diffuse disease but no hemodynamically significant lesions. The common and external iliac arteries are widely patent bilaterally.  The right common femoral is widely patent as is the profunda femoris.  The SFA does not have any significant stenosis.  The distal popliteal patent and the trifurcation is heavily diseased with occlusion of the anterior tibial, peroneal  and posterior tibial.  The anterior tibial and posterior tibial are occluded throughout the vast majority of their course anterior tibial does not reconstitute any visible dorsalis pedis. Just above the level of the malleolus collaterals reconstitute the posterior tibial which fills the medial and lateral plantar vessels. Approximately 8-10 cm distal to the origin of the peroneal there is reconstitution of the peroneal and it remains patent and the dominant vessel down to the ankle where a collateralizes to both the distal posterior tibial as well as the lateral plantar. The peroneal is the dominant runoff to the foot.  Following angioplasty the peroneal is now widely patent with in-line flow down to the foot and  looks quite nice.  Summary: Successful recanalization right lower cavity for limb salvage   Disposition: Patient was taken to the recovery room in stable condition having tolerated the procedure well.  Edward Boyer, Edward Boyer 06/21/2015,5:20 PM

## 2015-06-21 NOTE — Progress Notes (Signed)
[facility] - [place] at [facility]   PATIENT NAME: Edward Boyer    MR#:  [id number]  DATE OF BIRTH:  1960-02-25  SUBJECTIVE:  CHIEF COMPLAINT:  No chief complaint on file.  - Right foot toe gangrene status post debridement, IND, second toe amputation and third and second metatarsal head amputation. -On IV antibiotics for osteomyelitis. -For angiogram today to attempt revascularization in the right leg.  REVIEW OF SYSTEMS:  Review of Systems  Constitutional: Negative for fever, chills and malaise/fatigue.  HENT: Negative for ear discharge, ear pain and nosebleeds.   Eyes: Negative for blurred vision and double vision.  Respiratory: Negative for cough, shortness of breath and wheezing.   Cardiovascular: Negative for chest pain, palpitations and leg swelling.  Gastrointestinal: Negative for nausea, vomiting, abdominal pain, diarrhea and constipation.  Genitourinary: Negative for dysuria and urgency.  Musculoskeletal: Positive for joint pain. Negative for myalgias.  Neurological: Negative for dizziness, sensory change, speech change, focal weakness, seizures and headaches.  Psychiatric/Behavioral: Negative for depression. The patient is nervous/anxious.     DRUG ALLERGIES:   Allergies  Allergen Reactions  . Penicillins     VITALS:  Blood pressure 106/60, pulse 88, temperature 98.2 F (36.8 C), temperature source Oral, resp. rate 20, height 5\' 11"  (1.803 m), weight 107.049 kg (236 lb), SpO2 96 %.  PHYSICAL EXAMINATION:  Physical Exam  GENERAL:  55 y.o.-year-old patient lying in the bed with no acute distress.  EYES: Pupils equal, round, reactive to light and accommodation. No scleral icterus. Extraocular muscles intact.  HEENT: Head atraumatic, normocephalic. Oropharynx and nasopharynx clear.  NECK:  Supple, no jugular venous distention. No thyroid enlargement, no tenderness.  LUNGS: Normal breath sounds bilaterally, no wheezing, rales,rhonchi or  crepitation. No use of accessory muscles of respiration. Decreased bibasilar breath sounds CARDIOVASCULAR: S1, S2 normal. No murmurs, rubs, or gallops. CABG changes on the chest. ABDOMEN: Soft, nontender, nondistended. Bowel sounds present. No organomegaly or mass.  EXTREMITIES: No pedal edema, cyanosis, or clubbing.Right foot second toe status post amputation, dressing in place and wound VAC in place. NEUROLOGIC: Cranial nerves II through XII are intact. Muscle strength 5/5 in all extremities. Sensation intact. Gait not checked.  PSYCHIATRIC: The patient is alert and oriented x 3.  SKIN: No obvious rash, lesion, or ulcer.    LABORATORY PANEL:   CBC  Recent Labs Lab 06/21/15 0424  WBC 11.0*  HGB 8.8*  HCT 27.3*  PLT 244   ------------------------------------------------------------------------------------------------------------------  Chemistries   Recent Labs Lab 06/19/15 1929 06/21/15 0424  NA 137 138  K 4.6 4.4  CL 108 109  CO2 22 21*  GLUCOSE 142* 102*  BUN 21* 20  CREATININE 1.24 1.45*  CALCIUM 8.5* 8.2*  AST 16  --   ALT 16*  --   ALKPHOS 69  --   BILITOT 0.5  --    ------------------------------------------------------------------------------------------------------------------  Cardiac Enzymes No results for input(s): TROPONINI in the last 168 hours. ------------------------------------------------------------------------------------------------------------------  RADIOLOGY:  Mr Foot Right Wo Contrast  06/20/2015  CLINICAL DATA:  The patient stepped on a staple approximately 3 weeks ago resulting in a right foot wound which subsequently became infected. Diabetic patient. Gangrenous appearance of the right second toe. EXAM: MRI OF THE RIGHT FOREFOOT WITHOUT CONTRAST TECHNIQUE: Multiplanar, multisequence MR imaging was performed. No intravenous contrast was administered. COMPARISON:  Plain films of the right foot 06/19/2015. FINDINGS: Subcutaneous edema is  present over the dorsum of the foot. In the plantar soft tissues at the second  web space and deep to the first MTP joint, a fluid collection measuring 2.6 cm transverse by 1.5 cm craniocaudal by 1.4 cm long is identified worrisome for abscess. A second fluid collection is seen in the dorsal soft tissues which measures 1.8 cm transverse by approximately 2.7 cm long by 0.8 cm craniocaudal. This collection begins proximally superficial to the second metatarsal and distally along the medial aspect of the second toe. It is immediately superficial to the flexor tendons of the first toe. Finally, a small amount of fluid is seen between the heads of the third and fourth metatarsals. Marrow edema is seen in the distal 2.3 cm of the second metatarsal and in the plantar aspect of the head of the third metatarsal. Mild marrow edema is also seen in the base of the proximal phalanx of the second toe. The patient has first MTP osteoarthritis. Mild marrow edema in the medial sesamoid bone is likely related to degenerative disease. Intrinsic musculature of the foot demonstrates increased T2 signal and mild fatty replacement. No tendon tear is identified. IMPRESSION: Findings consistent with cellulitis about the right foot. Abscesses are seen along the distal second metatarsal and second toe and plantar soft tissues deep to the first MTP joint as described above. Findings consistent with osteomyelitis in the distal second metatarsal, head of the third metatarsal and base of the proximal phalanx of the second toe. Tiny amount of fluid between the heads of the third and fourth metatarsals is most consistent with bursitis, likely aseptic. Electronically Signed   By: Drusilla Kanner M.D.   On: 06/20/2015 12:14   Dg Foot 2 Views Right  06/19/2015  CLINICAL DATA:  Patient stepped on a piece of metal that went through his foot mid May. The foreign body was removed and infection set in. He has redness to the posterior ball of his foot,  the anterior foot including his toes. His 2nd digit is shrunken and black. There is visible redness and swelling to the adjacent digits. Some dried blisters/scabs. Hx diabetes, mi, cad, htn, pvd, stroke EXAM: RIGHT FOOT - 2 VIEW COMPARISON:  None. FINDINGS: No fracture.  Joints are normally spaced and aligned. No radiopaque foreign body. There are no areas of bone resorption to suggest osteomyelitis. There are prominent dorsal and plantar calcaneal spurs. IMPRESSION: 1. No fracture dislocation. 2. No radiopaque foreign body. 3. No radiographic evidence of osteomyelitis. Electronically Signed   By: Amie Portland M.D.   On: 06/19/2015 20:00    EKG:  No orders found for this or any previous visit.  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  - Status post second metatarsal and third metatarsal head and second toe amputation. -Follow up cultures. Currently on vancomycin and Levaquin. Infectious disease consulted - also received clindamycin prior to procedure yesterday  #2 Gangrene of toe- vascular following- for angiogram today  #3 CAD s/p CABG in Feb 2017 at [facility]- stable for now - on asa, coreg, statin, lisinopril Has ischemic cardiomyopathy and last known EF of 30%  #4 diabetes mellitus-on 70/30 insulin 35 units in the morning and 15 units at bedtime. Also on metformin. -  Currently only on sliding scale insulin as he is nothing by mouth -Monitor after the procedure and restart medications - a1c is 6.7, sugars well controlled  #5 CK D stage III-stable at this time  monitor on antibiotics  #6 DVT prophylaxis-on Lovenox    All the records are reviewed and case discussed with Care Management/Social Workerr. Management plans discussed with the patient, family and they are in agreement.  CODE STATUS: Full code  TOTAL TIME TAKING CARE OF THIS PATIENT: 38 minutes.   POSSIBLE D/C IN 2-3 DAYS, DEPENDING ON CLINICAL CONDITION.   Enid Baas M.D on 06/21/2015 at 12:02 PM  Between 7am to 6pm - Pager - [phone]  After 6pm go to [email or web address] - password [email or web address] [facility]  [facility]  Office  [phone]  CC: Primary care physician; No PCP Per Patient

## 2015-06-21 NOTE — Progress Notes (Signed)
Pt cont's clinically stable post procedure, left groin without bleeding nor hematoma, post starclosure. sr per monitor, mother present with patient, Dr Gilda CreaseSchnier out too speak with pt/mother,with questions answered, wound vac remains on, [facility] with plan reviewed.

## 2015-06-21 NOTE — Progress Notes (Signed)
Procedure done, tolerated well, vss, starclosure, per Dr Gilda CreaseSchnier, out to speak with mother regarding procedure,.

## 2015-06-21 NOTE — Progress Notes (Signed)
Pharmacy Antibiotic Note  Edward Boyer is a 55 y.o. male admitted on 06/19/2015 with wound culture.  Pharmacy has been consulted for Vancomcyin dosing.  Plan: 6/19 :  Vanc trough @ 20:30 = 13 mcg/mL  Will increase dose to Vancomycin 1500 mg IV Q18H to start on 6/20 @ 16:00. Will draw next trough on 6/21 @ 0930.  Height: 5\' 11"  (180.3 cm) Weight: 236 lb (107.049 kg) IBW/kg (Calculated) : 75.3  Temp (24hrs), Avg:98.9 F (37.2 C), Min:98 F (36.7 C), Max:99.8 F (37.7 C)   Recent Labs Lab 06/19/15 1929 06/19/15 1935 06/20/15 0448 06/21/15 0424 06/21/15 2042  WBC 12.7*  --  12.2* 11.0*  --   CREATININE 1.24  --   --  1.45*  --   VANCOTROUGH  --   --   --   --  13  VANCORANDOM  --  12  --   --   --     Estimated Creatinine Clearance: 73.3 mL/min (by C-G formula based on Cr of 1.45).    Allergies  Allergen Reactions  . Penicillins     Antimicrobials this admission:   >>    >>   Dose adjustments this admission: Vancomycin 1500 mg IV Q24H increased to Vancomycin 1500 mg IV Q18H on 6/19 @ 22:00  Microbiology results:  BCx:   UCx:    Sputum:    MRSA PCR:   Thank you for allowing pharmacy to be a part of this patient's care.  Jonathan Kirkendoll D 06/21/2015 9:34 PM

## 2015-06-22 ENCOUNTER — Inpatient Hospital Stay: Payer: Managed Care, Other (non HMO)

## 2015-06-22 ENCOUNTER — Encounter: Payer: Self-pay | Admitting: Vascular Surgery

## 2015-06-22 LAB — BASIC METABOLIC PANEL
ANION GAP: 7 (ref 5–15)
BUN: 16 mg/dL (ref 6–20)
CHLORIDE: 110 mmol/L (ref 101–111)
CO2: 22 mmol/L (ref 22–32)
Calcium: 8 mg/dL — ABNORMAL LOW (ref 8.9–10.3)
Creatinine, Ser: 1.28 mg/dL — ABNORMAL HIGH (ref 0.61–1.24)
GFR calc Af Amer: >60 mL/min (ref >60)
Glucose, Bld: 107 mg/dL — ABNORMAL HIGH (ref 65–99)
POTASSIUM: 3.9 mmol/L (ref 3.5–5.1)
SODIUM: 139 mmol/L (ref 135–145)

## 2015-06-22 LAB — GLUCOSE, CAPILLARY
GLUCOSE-CAPILLARY: 122 mg/dL — AB (ref 65–99)
GLUCOSE-CAPILLARY: 137 mg/dL — AB (ref 65–99)
Glucose-Capillary: 135 mg/dL — ABNORMAL HIGH (ref 65–99)
Glucose-Capillary: 132 mg/dL — ABNORMAL HIGH (ref 65–99)

## 2015-06-22 LAB — C-REACTIVE PROTEIN: CRP: 18 mg/dL — ABNORMAL HIGH (ref <1.0)

## 2015-06-22 LAB — SURGICAL PATHOLOGY

## 2015-06-22 LAB — SEDIMENTATION RATE: SED RATE: 110 mm/h — AB (ref 0–20)

## 2015-06-22 MED ORDER — SENNA 8.6 MG PO TABS
2.0000 | ORAL_TABLET | Freq: Every day | ORAL | Status: DC
  Administered 2015-06-22 – 2015-06-23 (×2): 17.2 mg via ORAL
  Filled 2015-06-22 (×2): qty 2

## 2015-06-22 MED ORDER — SENNA 8.6 MG PO TABS: 2.0000 | ORAL_TABLET | Freq: Every day | ORAL | Status: DC

## 2015-06-22 MED ORDER — POLYETHYLENE GLYCOL 3350 17 G PO PACK
17.0000 g | PACK | Freq: Every day | ORAL | Status: AC
  Administered 2015-06-22 – 2015-06-23 (×2): 17 g via ORAL
  Filled 2015-06-22 (×2): qty 1

## 2015-06-22 NOTE — Progress Notes (Signed)
Physical Therapy Treatment Patient Details Name: Edward Boyer MRN: [id number] DOB: 1960/06/11 Today's Date: 06/22/2015    History of Present Illness Pt is a 55 y.o. male presenting to hospital with gangrene R 2nd toe (recent puncture wound with foreign body nail) and [facility]  Pt admitted with atherosclerotic occlusive disease B LE's associated with gangrene R 2nd toe.  Pt s/p R 2nd toe and metatarsal head and R 3rd metatarsal head amputation d/t osteomyelitis and also debridement d/t abscess R great toe 06/19/17 with wound vac placement.  Plan for angiography 06/21/15.  PMH includes DM, MI, CAD, htn, PVD, stroke, CABG (in February).    PT Comments    Pt agreeable to PT and up to chair. Reports pain in R foot in moderate, but tolerable. Notes he is trying to limit amount of pain medication required. Pt participates well with supine and seated exercises. While sitting edge of bed, MD arrives. MD wishes to change wound vac; pt returned to supine position and MD addressed wound and wound vac. Pt agreeable to continue session post MD visit for up in chair. PT visibly and verbally notes anxiety and feelings of being overwhelmed with MD report/prognosis at this time. Pt requires some increased instruction for stand transfer for ease of complying with weightbearing status and mildly anxious. Pt ambulates to chair with inability to clear left foot very well with hops and ultimately resorts to pivoting on Left lower extremity. Pt comfortable up in chair and set up for lunch. Continue PT to progress strength, endurance to allow for improved transfers and ambulation for functional mobility.   Follow Up Recommendations  SNF     Equipment Recommendations       Recommendations for Other Services       Precautions / Restrictions Restrictions Weight Bearing Restrictions: Yes RLE Weight Bearing: Non weight bearing    Mobility  Bed Mobility Overal bed mobility: Modified  Independent Bed Mobility: Supine to Sit;Sit to Supine     Supine to sit: Modified independent (Device/Increase time) Sit to supine: Modified independent (Device/Increase time)   General bed mobility comments: Increased time  Transfers Overall transfer level: Needs assistance Equipment used: Rolling walker (2 wheeled) Transfers: Sit to/from Stand Sit to Stand: Min assist         General transfer comment: Requires cues for best hand placement; increased time to stand/mild anxiety  Ambulation/Gait Ambulation/Gait assistance: Min guard Ambulation Distance (Feet): 5 Feet Assistive device: Rolling walker (2 wheeled) Gait Pattern/deviations:  (Hop and pivot on LLE; NWB R) Gait velocity: decreased Gait velocity interpretation: <1.8 ft/sec, indicative of risk for recurrent falls General Gait Details: Small rapid hops on LLE, bracing RLE against L. Takes small rapid hops with little clearance and ultimately resorts to pivoting on LLE.    Stairs            Wheelchair Mobility    Modified Rankin (Stroke Patients Only)       Balance Overall balance assessment: Needs assistance Sitting-balance support: Bilateral upper extremity supported (L foot supported) Sitting balance-Leahy Scale: Normal     Standing balance support: Bilateral upper extremity supported Standing balance-Leahy Scale: Fair Standing balance comment: R NWB; mild unsteadiness with ambulation                    Cognition Arousal/Alertness: Awake/alert Behavior During Therapy: WFL for tasks assessed/performed Overall Cognitive Status: Within Functional Limits for tasks assessed  Exercises General Exercises - Lower Extremity Quad Sets: Strengthening;Both;20 reps;Supine Short Arc Quad: AROM;Right;10 reps;Supine Long Arc Quad: AROM;Left;20 reps;Seated Hip ABduction/ADduction: AROM;Left;20 reps;Seated (LE in extension) Hip Flexion/Marching: AROM;Both;20 reps;Seated Toe  Raises: AROM;Left;20 reps;Seated Heel Raises: AROM;Left;20 reps;Seated    General Comments General comments (skin integrity, edema, etc.): R toes purple/black. Wound vac changed/dressed during session      Pertinent Vitals/Pain Pain Assessment:  (Does not quantify; moderate pain R foot) Pain Location: R foot Pain Intervention(s): Limited activity within patient's tolerance;Premedicated before session;Monitored during session;Repositioned    Home Living                      Prior Function            PT Goals (current goals can now be found in the care plan section) Progress towards PT goals: Progressing toward goals    Frequency  7X/week    PT Plan Current plan remains appropriate    Co-evaluation             End of Session Equipment Utilized During Treatment: Gait belt Activity Tolerance: Patient limited by fatigue;Other (comment) (MD in during session to change wound vac/post anxiety) Patient left: in chair;with call bell/phone within reach;with chair alarm set;with family/visitor present     Time: 1131-1219 PT Time Calculation (min) (ACUTE ONLY): 48 min  Charges:  $Gait Training: 8-22 mins $Therapeutic Exercise: 8-22 mins                    G Codes:      Kristeen Miss, PTA 06/22/2015, 1:30 PM

## 2015-06-22 NOTE — Progress Notes (Addendum)
[facility] - [place] at [facility]   PATIENT NAME: Edward Boyer    MR#:  [id number]  DATE OF BIRTH:  06/07/1960  SUBJECTIVE:  CHIEF COMPLAINT:  No chief complaint on file.  - Right foot toe gangrene status post debridement second toe amputation and third and second metatarsal head amputation. However the first toe also appearing necrotic- patient optimistic that it may not need amputation - s/p angiogram and right leg revascularization yesterday  REVIEW OF SYSTEMS:  Review of Systems  Constitutional: Negative for fever, chills and malaise/fatigue.  HENT: Negative for ear discharge, ear pain and nosebleeds.   Eyes: Negative for blurred vision and double vision.  Respiratory: Negative for cough, shortness of breath and wheezing.   Cardiovascular: Negative for chest pain, palpitations and leg swelling.  Gastrointestinal: Negative for nausea, vomiting, abdominal pain, diarrhea and constipation.  Genitourinary: Negative for dysuria and urgency.  Musculoskeletal: Positive for joint pain. Negative for myalgias.  Neurological: Negative for dizziness, sensory change, speech change, focal weakness, seizures and headaches.  Psychiatric/Behavioral: Negative for depression. The patient is nervous/anxious.     DRUG ALLERGIES:   Allergies  Allergen Reactions  . Penicillins     VITALS:  Blood pressure 133/58, pulse 85, temperature 98.7 F (37.1 C), temperature source Oral, resp. rate 17, height  (1.803 m), weight 104.418 kg (230 lb 3.2 oz), SpO2 99 %.  PHYSICAL EXAMINATION:  Physical Exam  GENERAL:  55 y.o.-year-old patient lying in the bed with no acute distress.  EYES: Pupils equal, round, reactive to light and accommodation. No scleral icterus. Extraocular muscles intact.  HEENT: Head atraumatic, normocephalic. Oropharynx and nasopharynx clear.  NECK:  Supple, no jugular venous distention. No thyroid enlargement, no tenderness.  LUNGS: Normal breath  sounds bilaterally, no wheezing, rales,rhonchi or crepitation. No use of accessory muscles of respiration. Decreased bibasilar breath sounds CARDIOVASCULAR: S1, S2 normal. No murmurs, rubs, or gallops. CABG changes on the chest. ABDOMEN: Soft, nontender, nondistended. Bowel sounds present. No organomegaly or mass.  EXTREMITIES: No pedal edema, cyanosis, or clubbing. Right foot second toe status post amputation, dressing in place and wound VAC in place. The first toe appears purplish and fluctuant. NEUROLOGIC: Cranial nerves II through XII are intact. Muscle strength 5/5 in all extremities. Sensation intact. Gait not checked.  PSYCHIATRIC: The patient is alert and oriented x 3.  SKIN: No obvious rash, lesion, or ulcer.    LABORATORY PANEL:   CBC  Recent Labs Lab 06/21/15 0424  WBC 11.0*  HGB 8.8*  HCT 27.3*  PLT 244   ------------------------------------------------------------------------------------------------------------------  Chemistries   Recent Labs Lab 06/19/15 1929  06/22/15 0455  NA 137  < > 139  K 4.6  < > 3.9  CL 108  < > 110  CO2 22  < > 22  GLUCOSE 142*  < > 107*  BUN 21*  < > 16  CREATININE 1.24  < > 1.28*  CALCIUM 8.5*  < > 8.0*  AST 16  --   --   ALT 16*  --   --   ALKPHOS 69  --   --   BILITOT 0.5  --   --   < > = values in this interval not displayed. ------------------------------------------------------------------------------------------------------------------  Cardiac Enzymes No results for input(s): TROPONINI in the last 168 hours. ------------------------------------------------------------------------------------------------------------------  RADIOLOGY:  No results found.  EKG:  No orders found for this or any previous visit.  ASSESSMENT AND PLAN:   55 year old male with past medical history  significant for diabetes mellitus, CAD status post CABG in February 2017, hypertension, peripheral neuropathy, CK D stage III presents with  right foot second toe gangrene and infection.  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  - Status post second metatarsal and third metatarsal head and second toe amputation. First toe appears necrotic may be- f/u podiatry input -Follow up cultures. on vancomycin and Levaquin. Infectious disease consulted - will likely need PICC line and long term IV antibiotics  #2 Gangrene of toe- vascular following- s/p angiogram and angioplasty to right peroneal  #3 [facility]- stable for now - on asa, coreg, statin, lisinopril Has ischemic cardiomyopathy and last known EF of 30% - d/c IV fluids now  #4 diabetes mellitus-  home dose of 70/30 insulin 35 units in the morning and 15 units at bedtime. Also on metformin.All are on hold now as sugars consistently on lower side -Currently only on sliding scale insulin -Monitor to restart medications - a1c is 6.7, sugars well controlled  #5 CK D stage III-stable at this time monitor on antibiotics  #6 DVT prophylaxis-on Lovenox  #7 Constipation- meds added   Physical Therapy consulted, recommended rehab for now   All the records are reviewed and case discussed with Care Management/Social Workerr. Management plans discussed with the patient, family and they are in agreement.  CODE STATUS: Full code  TOTAL TIME TAKING CARE OF THIS PATIENT: 38 minutes.   POSSIBLE D/C IN 2-3 DAYS, DEPENDING ON CLINICAL CONDITION.   Enid BaasKALISETTI,Tichina Koebel M.D on 06/22/2015 at 2:09 PM  Between 7am to 6pm - Pager - [phone]  After 6pm go to [email or web address] - password [email or web address] [facility]  [facility]  Office  [phone]  CC: Primary care physician; No PCP Per Patient

## 2015-06-22 NOTE — Consult Note (Signed)
[facility] Infectious Disease     Reason for Consult:osteomyelitis    Referring Physician: Denton Lank  Date of Admission:  06/19/2015   Active Problems:   Atherosclerosis of lower extremity with gangrene [facility])   HPI: Edward Boyer is a 55 y.o. male Acute complaint admitted June 17 from [facility] with infection of his right foot since he stepped on a staple 3-4 weeks prior.  He has a history of diabetes with peripheral neuropathy.  He apparently had removal of the staple with an I&D as well.  However he then developed progressive gangrenous changes and was admitted to our hospital.  He was seen by vascular surgery as well as podiatry. On June 18 he underwent amputation of the right second toe and excision of the second metatarsal head as well as excision of the third metatarsal head.  June 19 he underwent angioplasty  Past Medical History  Diagnosis Date  . Diabetes mellitus without complication ([place])   . Myocardial infarction ([place])   . Coronary artery disease   . Hypertension   . Peripheral vascular disease ([place])   . Stroke [facility])    Past Surgical History  Procedure Laterality Date  . Coronary artery bypass graft    . Amputation toe Right 06/20/2015    Procedure: AMPUTATION TOE and application of wound vac;  Surgeon: Samara Deist, DPM;  Location: [facility] ORS;  Service: Podiatry;  Laterality: Right;  . Irrigation and debridement foot Right 06/20/2015    Procedure: IRRIGATION AND DEBRIDEMENT FOOT;  Surgeon: Samara Deist, DPM;  Location: [facility] ORS;  Service: Podiatry;  Laterality: Right;  . Peripheral vascular catheterization Right 06/21/2015    Procedure: Abdominal Aortogram w/Lower Extremity;  Surgeon: Katha Cabal, MD;  Location: [facility] CV LAB;  Service: Cardiovascular;  Laterality: Right;   Social History  Substance Use Topics  . Smoking status: Never Smoker   . Smokeless tobacco: None  . Alcohol Use: None   Family History  Problem Relation Age of  Onset  . CAD Father   . Diabetes Mellitus II Father     Allergies:  Allergies  Allergen Reactions  . Penicillins     Current antibiotics: Antibiotics Given (last 72 hours)    Date/Time Action Medication Dose Rate   06/19/15 2044 Given   levofloxacin (LEVAQUIN) IVPB 750 mg 750 mg 100 mL/hr   06/19/15 2251 Given   vancomycin (VANCOCIN) 1,500 mg in sodium chloride 0.9 % 500 mL IVPB 1,500 mg 250 mL/hr   06/20/15 1333 Given   clindamycin (CLEOCIN) IVPB 900 mg 900 mg    06/20/15 1723 Given   levofloxacin (LEVAQUIN) IVPB 750 mg 750 mg 100 mL/hr   06/20/15 2112 Given   vancomycin (VANCOCIN) 1,500 mg in sodium chloride 0.9 % 500 mL IVPB 1,500 mg 250 mL/hr   06/21/15 2227 Given   vancomycin (VANCOCIN) 1,500 mg in sodium chloride 0.9 % 500 mL IVPB 1,500 mg 250 mL/hr      MEDICATIONS: . aspirin EC  81 mg Oral Daily  . atorvastatin  80 mg Oral q1800  . carvedilol  25 mg Oral BID WC  . clopidogrel  75 mg Oral Daily  . docusate sodium  100 mg Oral BID  . enoxaparin (LOVENOX) injection  40 mg Subcutaneous Q24H  . insulin aspart  0-15 Units Subcutaneous TID WC  . levofloxacin (LEVAQUIN) IV  750 mg Intravenous Q24H  . lisinopril  2.5 mg Oral Daily  . polyethylene glycol  17 g Oral Daily  .  senna  2 tablet Oral QHS  . vancomycin  1,500 mg Intravenous Q18H    Review of Systems - 11 systems reviewed and negative per HPI   OBJECTIVE: Temp:  [98 F (36.7 C)-99.8 F (37.7 C)] 98.7 F (37.1 C) (06/20 0535) Pulse Rate:  [78-92] 85 (06/20 1131) Resp:  [13-20] 17 (06/20 0535) BP: (96-141)/(58-78) 133/58 mmHg (06/20 0535) SpO2:  [88 %-99 %] 99 % (06/20 1131) Weight:  [104.418 kg (230 lb 3.2 oz)] 104.418 kg (230 lb 3.2 oz) (06/20 0500) Physical Exam  Constitutional: He is oriented to person, place, and time. He appears well-developed and well-nourished. No distress.  HENT: anicteric Mouth/Throat: Oropharynx is clear and moist. No oropharyngeal exudate.  Cardiovascular: Normal rate,  regular rhythm and normal heart sounds.  Pulmonary/Chest: Effort normal and breath sounds normal. No respiratory distress. He has no wheezes.  Abdominal: Soft. Bowel sounds are normal. He exhibits no distension. There is no tenderness.  Lymphadenopathy: He has no cervical adenopathy.  Neurological: He is alert and oriented to person, place, and time.  Skin: RLE with post op incision covered by wound vac. Great toe is dusky and cool Psychiatric: He has a normal mood and affect. His behavior is normal.     LABS: Results for orders placed or performed during the hospital encounter of 06/19/15 (from the past 48 hour(s))  Glucose, capillary     Status: Abnormal   Collection Time: 06/20/15  2:55 PM  Result Value Ref Range   Glucose-Capillary 102 (H) 65 - 99 mg/dL  Glucose, capillary     Status: None   Collection Time: 06/20/15  4:40 PM  Result Value Ref Range   Glucose-Capillary 98 65 - 99 mg/dL  Glucose, capillary     Status: Abnormal   Collection Time: 06/20/15  9:58 PM  Result Value Ref Range   Glucose-Capillary 103 (H) 65 - 99 mg/dL   Comment 1 Notify RN   CBC     Status: Abnormal   Collection Time: 06/21/15  4:24 AM  Result Value Ref Range   WBC 11.0 (H) 3.8 - 10.6 K/uL   RBC 3.53 (L) 4.40 - 5.90 MIL/uL   Hemoglobin 8.8 (L) 13.0 - 18.0 g/dL   HCT 27.3 (L) 40.0 - 52.0 %   MCV 77.3 (L) 80.0 - 100.0 fL   MCH 25.0 (L) 26.0 - 34.0 pg   MCHC 32.3 32.0 - 36.0 g/dL   RDW 17.8 (H) 11.5 - 14.5 %   Platelets 244 150 - 440 K/uL  Basic metabolic panel     Status: Abnormal   Collection Time: 06/21/15  4:24 AM  Result Value Ref Range   Sodium 138 135 - 145 mmol/L   Potassium 4.4 3.5 - 5.1 mmol/L   Chloride 109 101 - 111 mmol/L   CO2 21 (L) 22 - 32 mmol/L   Glucose, Bld 102 (H) 65 - 99 mg/dL   BUN 20 6 - 20 mg/dL   Creatinine, Ser 1.45 (H) 0.61 - 1.24 mg/dL   Calcium 8.2 (L) 8.9 - 10.3 mg/dL   GFR calc non Af Amer 54 (L) >60 mL/min   GFR calc Af Amer >60 >60 mL/min    Comment:  (NOTE) The eGFR has been calculated using the CKD EPI equation. This calculation has not been validated in all clinical situations. eGFR's persistently <60 mL/min signify possible Chronic Kidney Disease.    Anion gap 8 5 - 15  Glucose, capillary     Status: Abnormal   Collection  Time: 06/21/15  7:49 AM  Result Value Ref Range   Glucose-Capillary 128 (H) 65 - 99 mg/dL  Glucose, capillary     Status: None   Collection Time: 06/21/15 11:47 AM  Result Value Ref Range   Glucose-Capillary 82 65 - 99 mg/dL  Glucose, capillary     Status: Abnormal   Collection Time: 06/21/15  6:34 PM  Result Value Ref Range   Glucose-Capillary 100 (H) 65 - 99 mg/dL   Comment 1 Notify RN   Vancomycin, trough     Status: None   Collection Time: 06/21/15  8:42 PM  Result Value Ref Range   Vancomycin Tr 13 10 - 20 ug/mL  Glucose, capillary     Status: None   Collection Time: 06/21/15  9:08 PM  Result Value Ref Range   Glucose-Capillary 79 65 - 99 mg/dL  Basic metabolic panel     Status: Abnormal   Collection Time: 06/22/15  4:55 AM  Result Value Ref Range   Sodium 139 135 - 145 mmol/L   Potassium 3.9 3.5 - 5.1 mmol/L   Chloride 110 101 - 111 mmol/L   CO2 22 22 - 32 mmol/L   Glucose, Bld 107 (H) 65 - 99 mg/dL   BUN 16 6 - 20 mg/dL   Creatinine, Ser 1.28 (H) 0.61 - 1.24 mg/dL   Calcium 8.0 (L) 8.9 - 10.3 mg/dL   GFR calc non Af Amer >60 >60 mL/min   GFR calc Af Amer >60 >60 mL/min    Comment: (NOTE) The eGFR has been calculated using the CKD EPI equation. This calculation has not been validated in all clinical situations. eGFR's persistently <60 mL/min signify possible Chronic Kidney Disease.    Anion gap 7 5 - 15  Glucose, capillary     Status: Abnormal   Collection Time: 06/22/15  7:52 AM  Result Value Ref Range   Glucose-Capillary 122 (H) 65 - 99 mg/dL   Comment 1 Notify RN   Glucose, capillary     Status: Abnormal   Collection Time: 06/22/15 11:43 AM  Result Value Ref Range    Glucose-Capillary 137 (H) 65 - 99 mg/dL   Comment 1 Notify RN    No components found for: ESR, C REACTIVE PROTEIN MICRO: Recent Results (from the past 720 hour(s))  Aerobic/Anaerobic Culture (surgical/deep wound)     Status: None (Preliminary result)   Collection Time: 06/19/15  8:50 PM  Result Value Ref Range Status   Specimen Description ULCER  Final   Special Requests Immunocompromised  Final   Gram Stain   Final    NO WBC SEEN RARE SQUAMOUS EPITHELIAL CELLS PRESENT RARE GRAM VARIABLE ROD RARE GRAM POSITIVE COCCI Performed at [facility]    Culture   Final    CULTURE REINCUBATED FOR BETTER GROWTH NO ANAEROBES ISOLATED; CULTURE IN PROGRESS FOR 5 DAYS    Report Status PENDING  Incomplete  Surgical pcr screen     Status: None   Collection Time: 06/19/15 11:05 PM  Result Value Ref Range Status   MRSA, PCR NEGATIVE NEGATIVE Final   Staphylococcus aureus NEGATIVE NEGATIVE Final    Comment:        The Xpert SA Assay (FDA approved for NASAL specimens in patients over 86 years of age), is one component of a comprehensive surveillance program.  Test performance has been validated by [facility] for patients greater than or equal to 56 year old. It is not intended to diagnose infection nor to guide  or monitor treatment.   Aerobic/Anaerobic Culture (surgical/deep wound)     Status: None (Preliminary result)   Collection Time: 06/20/15  1:55 PM  Result Value Ref Range Status   Specimen Description FOOT ABSCESS RIGHT  Final   Special Requests NONE  Final   Gram Stain   Final    FEW WBC PRESENT, PREDOMINANTLY PMN NO SQUAMOUS EPITHELIAL CELLS SEEN FEW GRAM POSITIVE COCCI IN PAIRS Performed at [facility]    Culture   Final    FEW STAPHYLOCOCCUS AUREUS FEW GRAM NEGATIVE RODS NO ANAEROBES ISOLATED; CULTURE IN PROGRESS FOR 5 DAYS    Report Status PENDING  Incomplete    IMAGING: Mr Foot Right Wo Contrast  06/20/2015  CLINICAL DATA:  The patient stepped on  a staple approximately 3 weeks ago resulting in a right foot wound which subsequently became infected. Diabetic patient. Gangrenous appearance of the right second toe. EXAM: MRI OF THE RIGHT FOREFOOT WITHOUT CONTRAST TECHNIQUE: Multiplanar, multisequence MR imaging was performed. No intravenous contrast was administered. COMPARISON:  Plain films of the right foot 06/19/2015. FINDINGS: Subcutaneous edema is present over the dorsum of the foot. In the plantar soft tissues at the second web space and deep to the first MTP joint, a fluid collection measuring 2.6 cm transverse by 1.5 cm craniocaudal by 1.4 cm long is identified worrisome for abscess. A second fluid collection is seen in the dorsal soft tissues which measures 1.8 cm transverse by approximately 2.7 cm long by 0.8 cm craniocaudal. This collection begins proximally superficial to the second metatarsal and distally along the medial aspect of the second toe. It is immediately superficial to the flexor tendons of the first toe. Finally, a small amount of fluid is seen between the heads of the third and fourth metatarsals. Marrow edema is seen in the distal 2.3 cm of the second metatarsal and in the plantar aspect of the head of the third metatarsal. Mild marrow edema is also seen in the base of the proximal phalanx of the second toe. The patient has first MTP osteoarthritis. Mild marrow edema in the medial sesamoid bone is likely related to degenerative disease. Intrinsic musculature of the foot demonstrates increased T2 signal and mild fatty replacement. No tendon tear is identified. IMPRESSION: Findings consistent with cellulitis about the right foot. Abscesses are seen along the distal second metatarsal and second toe and plantar soft tissues deep to the first MTP joint as described above. Findings consistent with osteomyelitis in the distal second metatarsal, head of the third metatarsal and base of the proximal phalanx of the second toe. Tiny amount of  fluid between the heads of the third and fourth metatarsals is most consistent with bursitis, likely aseptic. Electronically Signed   By: Inge Rise M.D.   On: 06/20/2015 12:14   Dg Foot 2 Views Right  06/19/2015  CLINICAL DATA:  Patient stepped on a piece of metal that went through his foot mid May. The foreign body was removed and infection set in. He has redness to the posterior ball of his foot, the anterior foot including his toes. His 2nd digit is shrunken and black. There is visible redness and swelling to the adjacent digits. Some dried blisters/scabs. Hx diabetes, mi, cad, htn, pvd, stroke EXAM: RIGHT FOOT - 2 VIEW COMPARISON:  None. FINDINGS: No fracture.  Joints are normally spaced and aligned. No radiopaque foreign body. There are no areas of bone resorption to suggest osteomyelitis. There are prominent dorsal and plantar calcaneal spurs. IMPRESSION:  1. No fracture dislocation. 2. No radiopaque foreign body. 3. No radiographic evidence of osteomyelitis. Electronically Signed   By: Lajean Manes M.D.   On: 06/19/2015 20:00    Assessment:   Edward Boyer is a 55 y.o. 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 He is now s/p surgical resction and revascularization.  Culture from June 17 has gram variable rods gram-positive cocci and is pending.  Culture from June 18 is growing staph aureus and has gram-negative rods in his pending final ID.  MRSA PCR on admission was negative Recommendations Place PICC Will need at least 2-4 weeks IV abx Continue vanco and levo for now pending culture results  Thank you very much for allowing me to participate in the care of this patient. Please call with questions.   Cheral Marker. Ola Spurr, MD

## 2015-06-22 NOTE — [place] FL2 (Signed)
MEDICAID FL2 LEVEL OF CARE SCREENING TOOL     IDENTIFICATION  Patient Name: Edward Boyer Birthdate: 10-14-1960 Sex: male Admission Date (Current Location): 06/19/2015  [place]   Octavio Manns([place], [place] )   Facility and Address:  [facility], [address], [place], [place] [address]      Provider Number: [id number]  Attending Physician Name and Address:  Renford DillsGregory G Schnier, MD  Relative Name and Phone Number:       Current Level of Care: Hospital Recommended Level of Care: Skilled Nursing Facility Prior Approval Number:    Date Approved/Denied:   PASRR Number:    Discharge Plan: SNF    Current Diagnoses: Patient Active Problem List   Diagnosis Date Noted  . Atherosclerosis of lower extremity with gangrene (HCC) 06/19/2015    Orientation RESPIRATION BLADDER Height & Weight     Self, Time, Situation, Place  Normal Continent Weight: 230 lb 3.2 oz (104.418 kg) Height:  5\' 11"  (180.3 cm)  BEHAVIORAL SYMPTOMS/MOOD NEUROLOGICAL BOWEL NUTRITION STATUS   (none )  (none ) Continent Diet (Diet: Renal/ Carb Modified )  AMBULATORY STATUS COMMUNICATION OF NEEDS Skin   Extensive Assist Verbally Wound Vac, Surgical wounds (Incision: Right Foot (Wound Vac)  )                       Personal Care Assistance Level of Assistance  Bathing, Feeding, Dressing Bathing Assistance: Limited assistance Feeding assistance: Independent Dressing Assistance: Limited assistance     Functional Limitations Info  Sight, Hearing, Speech Sight Info: Adequate Hearing Info: Adequate Speech Info: Adequate    SPECIAL CARE FACTORS FREQUENCY  PT (By licensed PT), OT (By licensed OT) (IV Abx)     PT Frequency:  (5) OT Frequency:  (5)            Contractures      Additional Factors Info  Code Status, Allergies Code Status Info:  (Full Code. ) Allergies Info:  (Penicillins )           Current Medications (06/22/2015):   This is the current hospital active medication list Current Facility-Administered Medications  Medication Dose Route Frequency Provider Last Rate Last Dose  . 0.9 %  sodium chloride infusion   Intravenous Continuous Renford DillsGregory G Schnier, MD 50 mL/hr at 06/22/15 1020    . acetaminophen (TYLENOL) tablet 650 mg  650 mg Oral Q6H PRN Renford DillsGregory G Schnier, MD       Or  . acetaminophen (TYLENOL) suppository 650 mg  650 mg Rectal Q6H PRN Renford DillsGregory G Schnier, MD      . aspirin EC tablet 81 mg  81 mg Oral Daily Renford DillsGregory G Schnier, MD   81 mg at 06/22/15 1016  . atorvastatin (LIPITOR) tablet 80 mg  80 mg Oral q1800 Renford DillsGregory G Schnier, MD   80 mg at 06/20/15 1723  . carvedilol (COREG) tablet 25 mg  25 mg Oral BID WC Renford DillsGregory G Schnier, MD   25 mg at 06/22/15 [id number]  . clopidogrel (PLAVIX) tablet 75 mg  75 mg Oral Daily Renford DillsGregory G Schnier, MD   75 mg at 06/22/15 1016  . docusate sodium (COLACE) capsule 100 mg  100 mg Oral BID Renford DillsGregory G Schnier, MD   100 mg at 06/22/15 1016  . enoxaparin (LOVENOX) injection 40 mg  40 mg Subcutaneous Q24H Renford DillsGregory G Schnier, MD   40 mg at 06/21/15 2227  . insulin aspart (novoLOG) injection 0-15 Units  0-15 Units Subcutaneous TID WC Renford Dills, MD   2 Units at 06/22/15 1200  . levofloxacin (LEVAQUIN) IVPB 750 mg  750 mg Intravenous Q24H Olene Floss, RPH   750 mg at 06/20/15 1723  . lisinopril (PRINIVIL,ZESTRIL) tablet 2.5 mg  2.5 mg Oral Daily Renford Dills, MD   2.5 mg at 06/22/15 1017  . magnesium hydroxide (MILK OF MAGNESIA) suspension 30 mL  30 mL Oral Daily PRN Renford Dills, MD      . morphine 2 MG/ML injection 2 mg  2 mg Intravenous Q1H PRN Renford Dills, MD      . nitroGLYCERIN (NITROSTAT) SL tablet 0.4 mg  0.4 mg Sublingual Q5 min PRN Renford Dills, MD      . ondansetron [facility]) tablet 4 mg  4 mg Oral Q6H PRN Renford Dills, MD       Or  . ondansetron [facility]) injection 4 mg  4 mg Intravenous Q6H PRN Renford Dills, MD      . oxyCODONE (Oxy  IR/ROXICODONE) immediate release tablet 10 mg  10 mg Oral Q3H PRN Renford Dills, MD   10 mg at 06/22/15 1115  . polyethylene glycol (MIRALAX / GLYCOLAX) packet 17 g  17 g Oral Daily Enid Baas, MD      . senna (SENOKOT) tablet 17.2 mg  2 tablet Oral QHS Enid Baas, MD      . sodium phosphate (FLEET) 7-19 GM/118ML enema 1 enema  1 enema Rectal Once PRN Renford Dills, MD      . sorbitol 70 % solution 30 mL  30 mL Oral Daily PRN Renford Dills, MD      . vancomycin (VANCOCIN) 1,500 mg in sodium chloride 0.9 % 500 mL IVPB  1,500 mg Intravenous Q18H Renford Dills, MD   1,500 mg at 06/21/15 2227  . zolpidem (AMBIEN) tablet 5 mg  5 mg Oral QHS PRN,MR X 1 Renford Dills, MD         Discharge Medications: Please see discharge summary for a list of discharge medications.  Relevant Imaging Results:  Relevant Lab Results:   Additional Information  (SSN: [id number])  Haig Prophet, LCSW

## 2015-06-22 NOTE — Progress Notes (Signed)
Daily Progress Note   Subjective  - 1 Day Post-Op  F/u right foot debridment.  Mild c/o pain to right foot.  Objective Filed Vitals:   06/21/15 1810 06/21/15 2110 06/22/15 0500 06/22/15 0535  BP: 101/59 141/66  133/58  Pulse: 80 92  84  Temp: 98 F (36.7 C) 99.8 F (37.7 C)  98.7 F (37.1 C)  TempSrc: Oral Oral  Oral  Resp: 17 20  17   Height:      Weight:   104.418 kg (230 lb 3.2 oz)   SpO2: 97% 98%  99%    Physical Exam: Wound vac changed.  Great toe is very dusky from Mpj distally. Extensive necrosis to plantar skin to mpj.  Wound open at 2nd to amputation site with areas of necrosis within.  No purulence  Laboratory CBC    Component Value Date/Time   WBC 11.0* 06/21/2015 0424   HGB 8.8* 06/21/2015 0424   HCT 27.3* 06/21/2015 0424   PLT 244 06/21/2015 0424    BMET    Component Value Date/Time   NA 139 06/22/2015 0455   K 3.9 06/22/2015 0455   CL 110 06/22/2015 0455   CO2 22 06/22/2015 0455   GLUCOSE 107* 06/22/2015 0455   BUN 16 06/22/2015 0455   CREATININE 1.28* 06/22/2015 0455   CALCIUM 8.0* 06/22/2015 0455   GFRNONAA >60 06/22/2015 0455   GFRAA >60 06/22/2015 0455    Intraop culture:  FEW STAPHYLOCOCCUS AUREUS  FEW GRAM NEGATIVE RODS  NO ANAEROBES ISOLATED; CULTURE IN PROGRESS FOR 5 DAYS          No sensitivities at this time  Assessment/Planning: Gangrene 2nd toe s/p amputation toe and joint and now early gangrene great toe with necrosis plantar 1st mtpj. S/P excision 3rd metatarsal head for osteomyelitis   Awaiting C & S at this time  S/P revascularization with peroneal artery flow with occlusion of anteriori tibial and posterior tibial arteries  At this time will await further demarcation but suspect will need trans-met amputation given loss of 2nd mtpj, 3rd metatarsal head and will need partial 1st ray amputation for necrosis.  D/W pt and will f/u tomorrow.  Samara Deist A  06/22/2015, 12:15 PM

## 2015-06-22 NOTE — Progress Notes (Signed)
[facility]  Daily Progress Note   Subjective: 1 Day Post-Op: Introduction catheter into right lower extremity 3rd order catheter placement, Contrast injection right lower extremity for distal runoff, Percutaneous transluminal angioplasty to 3 mm right peroneal, Star close closure left common femoral arteriotomy.  Patient with some right lower extremity discomfort this AM - relieved by pain medication.   Objective: Filed Vitals:   06/21/15 1810 06/21/15 2110 06/22/15 0500 06/22/15 0535  BP: 101/59 141/66  133/58  Pulse: 80 92  84  Temp: 98 F (36.7 C) 99.8 F (37.7 C)  98.7 F (37.1 C)  TempSrc: Oral Oral  Oral  Resp: 17 20  17   Height:      Weight:   104.418 kg (230 lb 3.2 oz)   SpO2: 97% 98%  99%    Intake/Output Summary (Last 24 hours) at 06/22/15 0805 Last data filed at 06/22/15 0510  Gross per 24 hour  Intake   1188 ml  Output   2200 ml  Net  -1012 ml   Physical Exam: A&Ox3, NAD CV: RRR Pulmonary: CTA Bilaterally Abdomen: Soft, Nontender, Nondistended Vascular:  Left Groin Access Site: OR dressing in place. Clean and Dry. No swelling or drainage noted.  Right Lower Extremity: warm, VAC in place.   Laboratory: CBC    Component Value Date/Time   WBC 11.0* 06/21/2015 0424   HGB 8.8* 06/21/2015 0424   HCT 27.3* 06/21/2015 0424   PLT 244 06/21/2015 0424   BMET    Component Value Date/Time   NA 139 06/22/2015 0455   K 3.9 06/22/2015 0455   CL 110 06/22/2015 0455   CO2 22 06/22/2015 0455   GLUCOSE 107* 06/22/2015 0455   BUN 16 06/22/2015 0455   CREATININE 1.28* 06/22/2015 0455   CALCIUM 8.0* 06/22/2015 0455   GFRNONAA >60 06/22/2015 0455   GFRAA >60 06/22/2015 0455   Assessment/Planning: 55 year old male s/p 1 Day Post-Op: Introduction catheter into right lower extremity 3rd order catheter placement, Contrast injection right lower extremity for distal runoff, Percutaneous transluminal angioplasty to 3 mm right peroneal, Star close  closure left common femoral arteriotomy - stable 1) Successful recanalization right lower cavity for limb salvage 2) Continue VAC therapy  Cleda DaubKimberly Jamirra Curnow PA-C 06/22/2015 8:05 AM

## 2015-06-22 NOTE — Clinical Social Work Note (Signed)
Clinical Social Work Assessment  Patient Details  Name: Edward Boyer MRN: [id number] Date of Birth: [birth year]  Date of referral:  06/22/15               Reason for consult:  Discharge Planning                Permission sought to share information with:  Family Supports Permission granted to share information::  Yes, Verbal Permission Granted  Name::        Agency::     Relationship::   (Mother and  Sister )  Sport and exercise psychologist Information:     Housing/Transportation Living arrangements for the past 2 months:  Single Family Home Source of Information:  Patient Patient Interpreter Needed:  None Criminal Activity/Legal Involvement Pertinent to Current Situation/Hospitalization:  No - Comment as needed Significant Relationships:  Other Family Members, Siblings, Parents Lives with:  Self Do you feel safe going back to the place where you live?  Yes Need for family participation in patient care:  No (Coment)  Care giving concerns:  Patient will require IV/ ABX and STR    Social Worker assessment / plan:  CSW met with patient, his mother and his sister at bedside. CSW introduced herself and her role. Per patient he lives in [place], [place]. Stated that he'd like SNF placement in [place], [place]. Reported that he needs assistance with finding facilities in the area. CSW explained STR in a SNF setting to patient. Patient reported that he is willing to do whatever it takes to get well. Stated he wants to be able to get back on his feet. Patient granted CSW verbal permission to complete a bed search in [place], [place] and [place]. FL2 completed and faxed to SNFs in [place], [place] and [place]. PASRR submitted but is pending due to patient being a resident in [place]. CSW will need a 30 day note if patient cannot be placed in [place], [place] will continue to follow and assist.   Employment status:  [employer] information:  Other (Comment Required) Psychologist, counselling) PT Recommendations:  [place] / Referral to community resources:  [place]  Patient/Family's Response to care:  Patient is in agreement for SNF placement at discharge.   Patient/Family's Understanding of and Emotional Response to Diagnosis, Current Treatment, and Prognosis:  Patient reports that he's received amazing service from [facility] staff. Stated that he understands his diagnosis and wants to get better. Reported he wants to get back on his feet.   Emotional Assessment Appearance:  Appears stated age Attitude/Demeanor/Rapport:   (None) Affect (typically observed):  Accepting, Calm, Pleasant Orientation:  Oriented to Self, Oriented to Place, Oriented to  Time, Oriented to Situation Alcohol / Substance use:  Not Applicable Psych involvement (Current and /or in the community):  No (Comment)  Discharge Needs  Concerns to be addressed:  Discharge Planning Concerns Readmission within the last 30 days:  No Current discharge risk:  Chronically ill Barriers to Discharge:  Continued Medical Work up   [employer], LCSW 06/22/2015, 4:39 PM

## 2015-06-23 LAB — GLUCOSE, CAPILLARY
GLUCOSE-CAPILLARY: 97 mg/dL (ref 65–99)
Glucose-Capillary: 124 mg/dL — ABNORMAL HIGH (ref 65–99)
Glucose-Capillary: 110 mg/dL — ABNORMAL HIGH (ref 65–99)
Glucose-Capillary: 126 mg/dL — ABNORMAL HIGH (ref 65–99)

## 2015-06-23 LAB — BASIC METABOLIC PANEL
ANION GAP: 6 (ref 5–15)
BUN: 17 mg/dL (ref 6–20)
CALCIUM: 7.9 mg/dL — AB (ref 8.9–10.3)
CO2: 22 mmol/L (ref 22–32)
Chloride: 109 mmol/L (ref 101–111)
Creatinine, Ser: 1.34 mg/dL — ABNORMAL HIGH (ref 0.61–1.24)
GFR, EST NON AFRICAN AMERICAN: 59 mL/min — AB (ref >60)
GLUCOSE: 111 mg/dL — AB (ref 65–99)
Potassium: 3.8 mmol/L (ref 3.5–5.1)
Sodium: 137 mmol/L (ref 135–145)

## 2015-06-23 LAB — CBC
HCT: 23.9 % — ABNORMAL LOW (ref 40.0–52.0)
HEMOGLOBIN: 8 g/dL — AB (ref 13.0–18.0)
MCH: 25.2 pg — ABNORMAL LOW (ref 26.0–34.0)
MCHC: 33.3 g/dL (ref 32.0–36.0)
MCV: 75.7 fL — ABNORMAL LOW (ref 80.0–100.0)
Platelets: 225 10*3/uL (ref 150–440)
RBC: 3.15 MIL/uL — ABNORMAL LOW (ref 4.40–5.90)
RDW: 18 % — AB (ref 11.5–14.5)
WBC: 9 10*3/uL (ref 3.8–10.6)

## 2015-06-23 LAB — VANCOMYCIN, TROUGH: VANCOMYCIN TR: 22 ug/mL — AB (ref 10–20)

## 2015-06-23 MED ORDER — VANCOMYCIN HCL 10 G IV SOLR
1500.0000 mg | INTRAVENOUS | Status: DC
  Filled 2015-06-23: qty 1500

## 2015-06-23 MED ORDER — BISACODYL 5 MG PO TBEC
10.0000 mg | DELAYED_RELEASE_TABLET | Freq: Every day | ORAL | Status: DC | PRN
  Administered 2015-06-25: 10 mg via ORAL
  Filled 2015-06-23: qty 2

## 2015-06-23 MED ORDER — BISACODYL 5 MG PO TBEC: 10.0000 mg | DELAYED_RELEASE_TABLET | Freq: Every day | ORAL | Status: DC | PRN

## 2015-06-23 MED ORDER — CEFAZOLIN IN D5W 1 GM/50ML IV SOLN
1.0000 g | Freq: Three times a day (TID) | INTRAVENOUS | Status: DC
  Administered 2015-06-23 – 2015-06-28 (×15): 1 g via INTRAVENOUS
  Filled 2015-06-23 (×18): qty 50

## 2015-06-23 MED ORDER — BISACODYL 5 MG PO TBEC
10.0000 mg | DELAYED_RELEASE_TABLET | Freq: Once | ORAL | Status: AC
  Administered 2015-06-23: 10 mg via ORAL
  Filled 2015-06-23: qty 2

## 2015-06-23 NOTE — Progress Notes (Signed)
Pharmacy Antibiotic Note  Edward Boyer is a 55 y.o. 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  Pharmacy has been consulted for Ancef dosing.  Plan: Will start Cefazolin 1 g IV q8 hours.   Height: 5\' 11"  (180.3 cm) Weight: 230 lb 3.2 oz (104.418 kg) IBW/kg (Calculated) : 75.3  Temp (24hrs), Avg:99 F (37.2 C), Min:98.3 F (36.8 C), Max:100 F (37.8 C)   Recent Labs Lab 06/19/15 1929 06/19/15 1935 06/20/15 0448 06/21/15 0424 06/21/15 2042 06/22/15 0455 06/23/15 0615 06/23/15 0934  WBC 12.7*  --  12.2* 11.0*  --   --  9.0  --   CREATININE 1.24  --   --  1.45*  --  1.28* 1.34*  --   VANCOTROUGH  --   --   --   --  13  --   --  22*  VANCORANDOM  --  12  --   --   --   --   --   --     Estimated Creatinine Clearance: 78.4 mL/min (by C-G formula based on Cr of 1.34).    Allergies  Allergen Reactions  . Penicillins     Antimicrobials this admission: Levofloxacin  6/18 >>    >>   Dose adjustments this admission:  Microbiology results:  BCx:   UCx:    Sputum:    MRSA PCR: Wound culture: MSSA and stenotrophomonas  Thank you for allowing pharmacy to be a part of this patient's care.  Grisel Blumenstock D 06/23/2015 7:20 PM

## 2015-06-23 NOTE — Progress Notes (Signed)
Pharmacy Antibiotic Note  Edward Boyer is a 55 y.o. male admitted on 06/19/2015 with wound culture.  Pharmacy has been consulted for Vancomcyin dosing.  Plan: 6/19 :  Vanc trough @ 20:30 = 13 mcg/mL  6/21 vanc trough @ 0934=22  This level was drawn ~ 8.5 hours after vanc was infused. Does not represent a trough. Last level of 13 was not at steady state. Steady state level could still result in a therapeutic level. Therefore no dose adjustment was needed. Patient last dose (0100 this AM) was given late bc PICC was being placed. It was given about  26.5 hours apart from previous dose. Will continue with 1500mg  q 24 hours. Will draw a trough prior to this evening dose which will be the 6th overall dose (including [facility] dose [facility]) and should represent steady state.   Height: 5\' 11"  (180.3 cm) Weight: 230 lb 3.2 oz (104.418 kg) IBW/kg (Calculated) : 75.3  Temp (24hrs), Avg:99 F (37.2 C), Min:98.3 F (36.8 C), Max:100 F (37.8 C)   Recent Labs Lab 06/19/15 1929 06/19/15 1935 06/20/15 0448 06/21/15 0424 06/21/15 2042 06/22/15 0455 06/23/15 0615 06/23/15 0934  WBC 12.7*  --  12.2* 11.0*  --   --  9.0  --   CREATININE 1.24  --   --  1.45*  --  1.28* 1.34*  --   VANCOTROUGH  --   --   --   --  13  --   --  22*  VANCORANDOM  --  12  --   --   --   --   --   --     Estimated Creatinine Clearance: 78.4 mL/min (by C-G formula based on Cr of 1.34).    Allergies  Allergen Reactions  . Penicillins     Antimicrobials this admission:   >>    >>   Dose adjustments this admission: Continue vancomycin 1500mg  q 24 hours  Microbiology results:  BCx:   UCx:    Sputum:    MRSA PCR:   Thank you for allowing pharmacy to be a part of this patient's care.  Olene FlossMelissa D Armelia Penton 06/23/2015 3:22 PM

## 2015-06-23 NOTE — Progress Notes (Signed)
[facility]  Daily Progress Note   Subjective: 2 Days Post-Op: Introduction catheter into right lower extremity 3rd order catheter placement, Contrast injection right lower extremity for distal runoff, Percutaneous transluminal angioplasty to 3 mm right peroneal, Star close closure left common femoral arteriotomy.  Patient with some right foot pain this AM knows he will need further amputation or debridement by podiatry. Had PICC line placed yesterday.   Objective: Filed Vitals:   06/22/15 0535 06/22/15 1131 06/22/15 2132 06/22/15 2224  BP: 133/58 109/59 103/54   Pulse: 84 85 81   Temp: 98.7 F (37.1 C)  100 F (37.8 C) 98.3 F (36.8 C)  TempSrc: Oral  Oral Oral  Resp: 17  19   Height:      Weight:      SpO2: 99% 99% 96%     Intake/Output Summary (Last 24 hours) at 06/23/15 0758 Last data filed at 06/23/15 0315  Gross per 24 hour  Intake   1498 ml  Output   1200 ml  Net    298 ml    Physical Exam: A&Ox3, NAD CV: RRR Pulmonary: CTA Bilaterally Right Extremity: PICC in place. No swelling or drainage noted.  Abdomen: Soft, Nontender, Nondistended Vascular: Left Groin Access Site: OR dressing in place. Clean and Dry. No swelling or drainage noted. Right Lower Extremity: warm, VAC in place. First toe necrotic.  Laboratory: CBC    Component Value Date/Time   WBC 9.0 06/23/2015 0615   HGB 8.0* 06/23/2015 0615   HCT 23.9* 06/23/2015 0615   PLT 225 06/23/2015 0615    BMET    Component Value Date/Time   NA 137 06/23/2015 0615   K 3.8 06/23/2015 0615   CL 109 06/23/2015 0615   CO2 22 06/23/2015 0615   GLUCOSE 111* 06/23/2015 0615   BUN 17 06/23/2015 0615   CREATININE 1.34* 06/23/2015 0615   CALCIUM 7.9* 06/23/2015 0615   GFRNONAA 59* 06/23/2015 0615   GFRAA >60 06/23/2015 0615    Assessment/Planning: 55 year old male s/p 2 Day Post-Op: Introduction catheter into right lower extremity 3rd order catheter placement,  Contrast injection right lower extremity for distal runoff, Percutaneous transluminal angioplasty to 3 mm right peroneal, Star close closure left common femoral arteriotomy - stable 1) Successful recanalization right lower cavity for limb salvage 2) Continue VAC therapy as per podiatry 3) PICC line placed for long term ABX 4) Will need further surgical intervention by podiatry.  Cleda DaubKimberly Izac Faulkenberry PA-C 06/23/2015 7:58 AM

## 2015-06-23 NOTE — Progress Notes (Addendum)
[facility] CLINIC INFECTIOUS DISEASE PROGRESS NOTE Date of Admission:  06/19/2015     ID: Edward Boyer is a 55 y.o. 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)   Subjective: No fevers, concerned abotu worsenign toe  ROS  Eleven systems are reviewed and negative except per hpi  Medications:  Antibiotics Given (last 72 hours)    Date/Time Action Medication Dose Rate   06/20/15 1723 Given   levofloxacin (LEVAQUIN) IVPB 750 mg 750 mg 100 mL/hr   06/20/15 2112 Given   vancomycin (VANCOCIN) 1,500 mg in sodium chloride 0.9 % 500 mL IVPB 1,500 mg 250 mL/hr   06/21/15 2227 Given   vancomycin (VANCOCIN) 1,500 mg in sodium chloride 0.9 % 500 mL IVPB 1,500 mg 250 mL/hr   06/22/15 1708 Given   levofloxacin (LEVAQUIN) IVPB 750 mg 750 mg 100 mL/hr   06/23/15 0105 Given   vancomycin (VANCOCIN) 1,500 mg in sodium chloride 0.9 % 500 mL IVPB 1,500 mg 250 mL/hr     . aspirin EC  81 mg Oral Daily  . atorvastatin  80 mg Oral q1800  . bisacodyl  10 mg Oral Once  . clopidogrel  75 mg Oral Daily  . docusate sodium  100 mg Oral BID  . enoxaparin (LOVENOX) injection  40 mg Subcutaneous Q24H  . insulin aspart  0-15 Units Subcutaneous TID WC  . levofloxacin (LEVAQUIN) IV  750 mg Intravenous Q24H  . senna  2 tablet Oral QHS  . vancomycin  1,500 mg Intravenous Q24H    Objective: Vital signs in last 24 hours: Temp:  [98.3 F (36.8 C)-100 F (37.8 C)] 98.7 F (37.1 C) (06/21 0948) Pulse Rate:  [81-86] 86 (06/21 0948) Resp:  [19-20] 20 (06/21 0948) BP: (103-110)/(54-56) 110/56 mmHg (06/21 0948) SpO2:  [96 %-98 %] 98 % (06/21 0948) Constitutional: He is oriented to person, place, and time. He appears well-developed and well-nourished. No distress.  HENT: anicteric Mouth/Throat: Oropharynx is clear and moist. No oropharyngeal exudate.  Cardiovascular: Normal rate, regular rhythm and normal heart sounds.  Pulmonary/Chest: Effort  normal and breath sounds normal. No respiratory distress. He has no wheezes.  Abdominal: Soft. Bowel sounds are normal. He exhibits no distension. There is no tenderness.  Lymphadenopathy: He has no cervical adenopathy.  Neurological: He is alert and oriented to person, place, and time.  Skin: RLE with post op incision covered by wound vac. Great toe is dusky and cool Psychiatric: He has a normal mood and affect. His behavior is normal.   Lab Results  Recent Labs  06/21/15 0424 06/22/15 0455 06/23/15 0615  WBC 11.0*  --  9.0  HGB 8.8*  --  8.0*  HCT 27.3*  --  23.9*  NA 138 139 137  K 4.4 3.9 3.8  CL 109 110 109  CO2 21* 22 22  BUN CREATININE 1.45* 1.28* 1.34*    Microbiology: Results for orders placed or performed during the hospital encounter of 06/19/15  Aerobic/Anaerobic Culture (surgical/deep wound)     Status: None (Preliminary result)   Collection Time: 06/19/15  8:50 PM  Result Value Ref Range Status   Specimen Description ULCER  Final   Special Requests Immunocompromised  Final   Gram Stain   Final    NO WBC SEEN RARE SQUAMOUS EPITHELIAL CELLS PRESENT RARE GRAM VARIABLE ROD RARE GRAM POSITIVE COCCI Performed at [facility]    Culture  Final    MODERATE STAPHYLOCOCCUS AUREUS MODERATE GRAM NEGATIVE RODS SUSCEPTIBILITIES PERFORMED ON PREVIOUS CULTURE WITHIN THE LAST 5 DAYS. NO ANAEROBES ISOLATED; CULTURE IN PROGRESS FOR 5 DAYS    Report Status PENDING  Incomplete  Surgical pcr screen     Status: None   Collection Time: 06/19/15 11:05 PM  Result Value Ref Range Status   MRSA, PCR NEGATIVE NEGATIVE Final   Staphylococcus aureus NEGATIVE NEGATIVE Final    Comment:        The Xpert SA Assay (FDA approved for NASAL specimens in patients over 53 years of age), is one component of a comprehensive surveillance program.  Test performance has been validated by [facility] for patients greater than or equal to 53 year old. It is not  intended to diagnose infection nor to guide or monitor treatment.   Aerobic/Anaerobic Culture (surgical/deep wound)     Status: None (Preliminary result)   Collection Time: 06/20/15  1:55 PM  Result Value Ref Range Status   Specimen Description FOOT ABSCESS RIGHT  Final   Special Requests NONE  Final   Gram Stain   Final    FEW WBC PRESENT, PREDOMINANTLY PMN NO SQUAMOUS EPITHELIAL CELLS SEEN FEW GRAM POSITIVE COCCI IN PAIRS    Culture   Final    FEW STAPHYLOCOCCUS AUREUS FEW STENOTROPHOMONAS MALTOPHILIA NO ANAEROBES ISOLATED; CULTURE IN PROGRESS FOR 5 DAYS    Report Status PENDING  Incomplete   Organism ID, Bacteria STENOTROPHOMONAS MALTOPHILIA  Final   Organism ID, Bacteria STAPHYLOCOCCUS AUREUS  Final      Susceptibility   Staphylococcus aureus - MIC*    CIPROFLOXACIN <=0.5 SENSITIVE Sensitive     ERYTHROMYCIN 2 RESISTANT Resistant     GENTAMICIN <=0.5 SENSITIVE Sensitive     OXACILLIN <=0.25 SENSITIVE Sensitive     TETRACYCLINE <=1 SENSITIVE Sensitive     VANCOMYCIN 1 SENSITIVE Sensitive     TRIMETH/SULFA <=10 SENSITIVE Sensitive     CLINDAMYCIN <=0.25 RESISTANT Resistant     RIFAMPIN <=0.5 SENSITIVE Sensitive     Inducible Clindamycin Value in next row Resistant      POSITIVEINDUCIBLE CLINDAMYCIN RESISTANCE - A positive ICR test is indicative of inducible resistance to macrolides, lincosamides, and type B streptogramin.  This isolate is presumed to be resistant to Clindamycin, however, Clindamycin may still be effective in some patients. Performed at [facility]    * FEW STAPHYLOCOCCUS AUREUS   Stenotrophomonas maltophilia - MIC*    LEVOFLOXACIN Value in next row Sensitive      POSITIVEINDUCIBLE CLINDAMYCIN RESISTANCE - A positive ICR test is indicative of inducible resistance to macrolides, lincosamides, and type B streptogramin.  This isolate is presumed to be resistant to Clindamycin, however, Clindamycin may still be effective in some patients. Performed at [facility]    TRIMETH/SULFA Value in next row Sensitive      POSITIVEINDUCIBLE CLINDAMYCIN RESISTANCE - A positive ICR test is indicative of inducible resistance to macrolides, lincosamides, and type B streptogramin.  This isolate is presumed to be resistant to Clindamycin, however, Clindamycin may still be effective in some patients. Performed at [facility]    * FEW STENOTROPHOMONAS MALTOPHILIA    Studies/Results: Dg Chest Port 1 View  06/22/2015  CLINICAL DATA:  PICC placement.  Initial encounter. EXAM: PORTABLE CHEST 1 VIEW COMPARISON:  None. FINDINGS: The patient's right PICC is noted ending about the distal SVC. The lungs are well-aerated and clear. There is no evidence of focal opacification, pleural effusion or  pneumothorax. The cardiomediastinal silhouette is borderline normal in size. The patient is status post median sternotomy, with evidence of prior CABG. No acute osseous abnormalities are seen. There is chronic deformity of the right clavicle. IMPRESSION: 1. Right PICC noted ending about the distal SVC. 2. No acute cardiopulmonary process seen. Electronically Signed   By: Roanna Raider M.D.   On: 06/22/2015 21:01    Assessment/Plan: Edward Boyer is a 54 y.o. 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 He is now s/p surgical resction and revascularization but still with residual occlusion and also need for repeat surgery to remove gangrenous great toe. Culture from June 17 has MSSA, GNR. Culture from June 18 is MSSA and stentotrophomonas  MRSA PCR on admission was negative PICC placed  Recommendations Change vanco to ancef for the MSSA - he has a hx of PCN causing a rash as a child but does not appear to be severe reaction and beta lactams are superior to other agents for MSSA. Discussed this with the patient Continue levo for stentotrophomonas but can change to oral if needed For repeat surgery Friday  Thank  you very much for the consult. Will follow with you.  Kimberlee Shoun P   06/23/2015, 3:26 PM

## 2015-06-23 NOTE — Progress Notes (Signed)
[facility] [place] at [facility]   PATIENT NAME: Edward Boyer    MR#:  [id number]  DATE OF BIRTH:  [birth year]  SUBJECTIVE:  CHIEF COMPLAINT:  No chief complaint on file.  - Right First toe still appears gangrenous and demarcating slowly, wound vac in place on the right foot after 2nd toe amputation and 2nd and 3rd metatarsal heads amputation - also had angiogram and angioplasty to the leg - PICC line placed for long term IV ABX - Repeat surgery on Friday for the first toe. Denies any other complaints  REVIEW OF SYSTEMS:  Review of Systems  Constitutional: Negative for fever, chills and malaise/fatigue.  HENT: Negative for ear discharge, ear pain and nosebleeds.   Eyes: Negative for blurred vision and double vision.  Respiratory: Negative for cough, shortness of breath and wheezing.   Cardiovascular: Negative for chest pain, palpitations and leg swelling.  Gastrointestinal: Negative for nausea, vomiting, abdominal pain, diarrhea and constipation.  Genitourinary: Negative for dysuria and urgency.  Musculoskeletal: Positive for joint pain. Negative for myalgias.  Neurological: Negative for dizziness, sensory change, speech change, focal weakness, seizures and headaches.  Psychiatric/Behavioral: Negative for depression. The patient is not nervous/anxious.     DRUG ALLERGIES:   Allergies  Allergen Reactions  . Penicillins     VITALS:  Blood pressure 110/56, pulse 86, temperature 98.7 F (37.1 C), temperature source Oral, resp. rate 20, height  (1.803 m), weight 104.418 kg (230 lb 3.2 oz), SpO2 98 %.  PHYSICAL EXAMINATION:  Physical Exam  GENERAL:  55 y.o.-year-old patient lying in the bed with no acute distress.  EYES: Pupils equal, round, reactive to light and accommodation. No scleral icterus. Extraocular muscles intact.  HEENT: Head atraumatic, normocephalic. Oropharynx and nasopharynx clear.  NECK:  Supple, no jugular venous distention.  No thyroid enlargement, no tenderness.  LUNGS: Normal breath sounds bilaterally, no wheezing, rales,rhonchi or crepitation. No use of accessory muscles of respiration. Decreased bibasilar breath sounds CARDIOVASCULAR: S1, S2 normal. No murmurs, rubs, or gallops. CABG changes on the chest. ABDOMEN: Soft, nontender, nondistended. Bowel sounds present. No organomegaly or mass.  EXTREMITIES: No pedal edema, cyanosis, or clubbing. Right foot second toe status post amputation, dressing in place and wound VAC in place. The first toe appears purplish and gangrenous. NEUROLOGIC: Cranial nerves II through XII are intact. Muscle strength 5/5 in all extremities. Sensation intact. Gait not checked.  PSYCHIATRIC: The patient is alert and oriented x 3.  SKIN: No obvious rash, lesion, or ulcer.    LABORATORY PANEL:   CBC  Recent Labs Lab 06/23/15 0615  WBC 9.0  HGB 8.0*  HCT 23.9*  PLT 225   ------------------------------------------------------------------------------------------------------------------  Chemistries   Recent Labs Lab 06/19/15 1929  06/23/15 0615  NA 137  < > 137  K 4.6  < > 3.8  CL 108  < > 109  CO2 22  < > 22  GLUCOSE 142*  < > 111*  BUN 21*  < > 17  CREATININE 1.24  < > 1.34*  CALCIUM 8.5*  < > 7.9*  AST 16  --   --   ALT 16*  --   --   ALKPHOS 69  --   --   BILITOT 0.5  --   --   < > = values in this interval not displayed. ------------------------------------------------------------------------------------------------------------------  Cardiac Enzymes No results for input(s): TROPONINI in the last 168 hours. ------------------------------------------------------------------------------------------------------------------  RADIOLOGY:  Dg Chest Port 1 View  06/22/2015  CLINICAL DATA:  PICC placement.  Initial encounter. EXAM: PORTABLE CHEST 1 VIEW COMPARISON:  None. FINDINGS: The patient's right PICC is noted ending about the distal SVC. The lungs are  well-aerated and clear. There is no evidence of focal opacification, pleural effusion or pneumothorax. The cardiomediastinal silhouette is borderline normal in size. The patient is status post median sternotomy, with evidence of prior CABG. No acute osseous abnormalities are seen. There is chronic deformity of the right clavicle. IMPRESSION: 1. Right PICC noted ending about the distal SVC. 2. No acute cardiopulmonary process seen. Electronically Signed   By: Roanna RaiderJeffery  Chang M.D.   On: 06/22/2015 21:01    EKG:  No orders found for this or any previous visit.  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  - Status post second metatarsal and third metatarsal head and second toe amputation. First toe appears necrotic - will need surgery again- scheduled for FRIDAY - cultures with staph aureus and stenotrophomonas.  - continue on vancomycin and Levaquin. Infectious disease consulted - Has a PICC line now and will need 2-4 weeks of IV ABX  #2 Gangrene of toe- vascular following- s/p angiogram and angioplasty to right peroneal - on asa and plavix  #3 CAD s/[facility]- stable for now - on asa, plavix and statin - coreg and lisinopril held due to low BP today Has ischemic cardiomyopathy and last known EF of 30%  #4 diabetes mellitus-  home dose of 70/30 insulin and  Metformin are on hold now as sugars consistently on lower side -Currently only on sliding scale insulin -Monitor to restart medications - a1c is 6.7, sugars well controlled  #5 CKD stage  III-stable at this time monitor on antibiotics  #6 DVT prophylaxis-on Lovenox  #7 Constipation- meds added   Physical Therapy consulted, recommended rehab for now Discussed with mother at bedside   All the records are reviewed and case discussed with Care Management/Social Workerr. Management plans discussed with the patient, family and they are in agreement.  CODE STATUS: Full code  TOTAL TIME TAKING CARE OF THIS PATIENT: 33 minutes.   POSSIBLE D/C EARLY NEXT WEEK, DEPENDING ON CLINICAL CONDITION.   Enid BaasKALISETTI,Jaslene Marsteller M.D on 06/23/2015 at 1:14 PM  Between 7am to 6pm - Pager - [phone]  After 6pm go to [email or web address] - password [email or web address] [facility]  [facility]  Office  [phone]  CC: Primary care physician; No PCP Per Patient

## 2015-06-23 NOTE — Progress Notes (Signed)
Daily Progress Note   Subjective  - 2 Days Post-Op  F/u right foot gangrenous changes, s/p 2nd partial ray amp and 3rd met head resection.    Objective Filed Vitals:   06/22/15 1131 06/22/15 2132 06/22/15 2224 06/23/15 0948  BP: 109/59 103/54  110/56  Pulse: 85 81  86  Temp:  100 F (37.8 C) 98.3 F (36.8 C) 98.7 F (37.1 C)  TempSrc:  Oral Oral Oral  Resp:  19  20  Height:      Weight:      SpO2: 99% 96%  98%    Physical Exam: Gangrenous great toe and plantar skinof mtpj.  Wound vac intact.  Areas of necrosis to skin just medial to 3rd met head area.    Laboratory CBC    Component Value Date/Time   WBC 9.0 06/23/2015 0615   HGB 8.0* 06/23/2015 0615   HCT 23.9* 06/23/2015 0615   PLT 225 06/23/2015 0615    BMET    Component Value Date/Time   NA 137 06/23/2015 0615   K 3.8 06/23/2015 0615   CL 109 06/23/2015 0615   CO2 22 06/23/2015 0615   GLUCOSE 111* 06/23/2015 0615   BUN 17 06/23/2015 0615   CREATININE 1.34* 06/23/2015 0615   CALCIUM 7.9* 06/23/2015 0615   GFRNONAA 59* 06/23/2015 0615   GFRAA >60 06/23/2015 0615    Assessment/Planning: Will plan for surgery, trans-met to right foot, on Friday am.  D/W pt and mother today.  Multiple questions answered.  D/w pt possible outcomes and high risk of further complications with his severe PVD.   Will f/u tomorrow for final decision and discussion.    Samara Deist A  06/23/2015, 2:40 PM

## 2015-06-23 NOTE — Progress Notes (Addendum)
Physical Therapy Treatment Patient Details Name: Edward Boyer MRN: [id number] DOB: January 22, 1960 Today's Date: 06/23/2015    History of Present Illness Pt is a 55 y.o. male presenting to hospital with gangrene R 2nd toe (recent puncture wound with foreign body nail) and transferred from [facility].  Pt admitted with atherosclerotic occlusive disease B LE's associated with gangrene R 2nd toe.  Pt s/p R 2nd toe and metatarsal head and R 3rd metatarsal head amputation d/t osteomyelitis and also debridement d/t abscess R great toe 06/19/17 with wound vac placement.  Plan for angiography 06/21/15.  PMH includes DM, MI, CAD, htn, PVD, stroke, CABG (in February).    PT Comments    Pt participates well with exercises and transfers with short ambulation to the chair. Session split in 2 with MD visit for 10 min during second session. (pt seen from 13:06 to13:20 and 13:59 to 1432). Pt dealing with new of second surgery requiring further amputation. Overall, pt processing and keeping positive attitude despite challenges. Pt continues to require increased time for functional mobility as well as Min assist. Encouraged pt to perform exercise several times. Continue PT to progress strength, endurance and all functional mobility.   Follow Up Recommendations  SNF     Equipment Recommendations       Recommendations for Other Services       Precautions / Restrictions Restrictions Weight Bearing Restrictions: Yes RLE Weight Bearing: Non weight bearing    Mobility  Bed Mobility Overal bed mobility: Modified Independent             General bed mobility comments: Increase time  Transfers Overall transfer level: Needs assistance Equipment used: Rolling walker (2 wheeled) Transfers: Sit to/from Stand Sit to Stand: Min assist         General transfer comment: Increased time to prepare to rise. Min A to steady  Ambulation/Gait Ambulation/Gait assistance: Min guard   Assistive  device: Rolling walker (2 wheeled) Gait Pattern/deviations:  (NWB R; small hops L) Gait velocity: decreased Gait velocity interpretation: <1.8 ft/sec, indicative of risk for recurrent falls General Gait Details: Continues with small hops/piivot and mild unsteady feeling   Stairs            Wheelchair Mobility    Modified Rankin (Stroke Patients Only)       Balance Overall balance assessment: Needs assistance Sitting-balance support: Bilateral upper extremity supported Sitting balance-Leahy Scale: Normal     Standing balance support: Bilateral upper extremity supported Standing balance-Leahy Scale: Fair Standing balance comment: R NWB                    Cognition Arousal/Alertness: Awake/alert Behavior During Therapy: WFL for tasks assessed/performed Overall Cognitive Status: Within Functional Limits for tasks assessed                      Exercises General Exercises - Lower Extremity Ankle Circles/Pumps: AROM;Left;20 reps;Supine Short Arc Quad: AROM;Strengthening;Both;20 reps;Supine (5 sec hold at top) Heel Slides: AROM;Strengthening;Both;20 reps;Supine (resisted ext on L) Hip ABduction/ADduction: AROM;Both;20 reps;Supine Straight Leg Raises: Strengthening;Both;20 reps;Supine (no hooklying on R)    General Comments General comments (skin integrity, edema, etc.): R toes purple/black. Boot ijn place with wound vac      Pertinent Vitals/Pain Pain Assessment: No/denies pain Pain Score: 0-No pain (Some pulling in LEs with exercises) Pain Intervention(s): Limited activity within patient's tolerance    Home Living  Prior Function            PT Goals (current goals can now be found in the care plan section) Progress towards PT goals: Progressing toward goals    Frequency  7X/week    PT Plan Current plan remains appropriate    Co-evaluation             End of Session Equipment Utilized During Treatment:  Gait belt Activity Tolerance: Patient tolerated treatment well Patient left: in chair;with call bell/phone within reach;with chair alarm set     Time: [phone] (interrupt for therapist meeting and MD in) PT Time Calculation (min) (ACUTE ONLY): 96 min  Charges:  $Gait Training: 8-22 mins $Therapeutic Exercise: 8-22 mins                    G Codes:      Kristeen MissHeidi Elizabeth Bishop, PTA 06/23/2015, 2:37 PM

## 2015-06-23 NOTE — Clinical Social Work Note (Signed)
CSW [facility] with [facility]. Referral sent via Epic yesterday was not received (even though both facilities had the in basket option) and so referral had to be manually faxed to both. Both facilities were able to offer. CSW spoke with patient and his mother and they chose [facility]. [facility] to begin [place] tomorrow due to potential of podiatry taking patient back to OR on Friday with discharge not anticipated until first of next week. York SpanielMonica Sharika Mosquera MSW,LCSW [phone]

## 2015-06-24 LAB — MAGNESIUM: Magnesium: 1.8 mg/dL (ref 1.7–2.4)

## 2015-06-24 LAB — BASIC METABOLIC PANEL
ANION GAP: 7 (ref 5–15)
BUN: 18 mg/dL (ref 6–20)
CHLORIDE: 108 mmol/L (ref 101–111)
CO2: 22 mmol/L (ref 22–32)
CREATININE: 1.24 mg/dL (ref 0.61–1.24)
Calcium: 7.9 mg/dL — ABNORMAL LOW (ref 8.9–10.3)
GFR calc non Af Amer: >60 mL/min (ref >60)
Glucose, Bld: 127 mg/dL — ABNORMAL HIGH (ref 65–99)
POTASSIUM: 3.7 mmol/L (ref 3.5–5.1)
SODIUM: 137 mmol/L (ref 135–145)

## 2015-06-24 LAB — GLUCOSE, CAPILLARY
GLUCOSE-CAPILLARY: 158 mg/dL — AB (ref 65–99)
GLUCOSE-CAPILLARY: 158 mg/dL — AB (ref 65–99)
Glucose-Capillary: 109 mg/dL — ABNORMAL HIGH (ref 65–99)
Glucose-Capillary: 103 mg/dL — ABNORMAL HIGH (ref 65–99)

## 2015-06-24 LAB — CBC
HEMATOCRIT: 25.4 % — AB (ref 40.0–52.0)
Hemoglobin: 8.4 g/dL — ABNORMAL LOW (ref 13.0–18.0)
MCH: 25.1 pg — ABNORMAL LOW (ref 26.0–34.0)
MCHC: 33.1 g/dL (ref 32.0–36.0)
MCV: 75.9 fL — AB (ref 80.0–100.0)
PLATELETS: 223 10*3/uL (ref 150–440)
RBC: 3.35 MIL/uL — AB (ref 4.40–5.90)
RDW: 17.4 % — ABNORMAL HIGH (ref 11.5–14.5)
WBC: 9.2 10*3/uL (ref 3.8–10.6)

## 2015-06-24 MED ORDER — LEVOFLOXACIN 750 MG PO TABS
750.0000 mg | ORAL_TABLET | Freq: Every day | ORAL | Status: DC
  Administered 2015-06-26 – 2015-06-28 (×3): 750 mg via ORAL
  Filled 2015-06-24 (×4): qty 1

## 2015-06-24 MED ORDER — SENNA 8.6 MG PO TABS
2.0000 | ORAL_TABLET | Freq: Two times a day (BID) | ORAL | Status: DC
  Administered 2015-06-24 – 2015-06-28 (×7): 17.2 mg via ORAL
  Filled 2015-06-24 (×7): qty 2

## 2015-06-24 MED ORDER — CARVEDILOL 3.125 MG PO TABS
3.1250 mg | ORAL_TABLET | Freq: Two times a day (BID) | ORAL | Status: DC
  Administered 2015-06-24 – 2015-06-28 (×7): 3.125 mg via ORAL
  Filled 2015-06-24 (×7): qty 1

## 2015-06-24 MED ORDER — LACTULOSE 10 GM/15ML PO SOLN: 20.0000 g | Freq: Two times a day (BID) | ORAL | Status: DC | PRN

## 2015-06-24 MED ORDER — POVIDONE-IODINE 7.5 % EX SOLN: Freq: Once | CUTANEOUS | Status: DC

## 2015-06-24 NOTE — Progress Notes (Signed)
[facility] -  at [facility]   PATIENT NAME: Edward Boyer    MR#:  [id number]  DATE OF BIRTH:  1960/03/06  SUBJECTIVE:  CHIEF COMPLAINT:  No chief complaint on file.  - Right First toe gangrenous, wound vac in place on the right foot after 2nd toe amputation and 2nd and 3rd metatarsal heads amputation- for transmet amputation of right foot tomorrow AM - also had angiogram and angioplasty to the leg - PICC line placed for long term IV ABX - Denies any other complaints  REVIEW OF SYSTEMS:  Review of Systems  Constitutional: Negative for fever, chills and malaise/fatigue.  HENT: Negative for ear discharge, ear pain and nosebleeds.   Eyes: Negative for blurred vision and double vision.  Respiratory: Negative for cough, shortness of breath and wheezing.   Cardiovascular: Negative for chest pain, palpitations and leg swelling.  Gastrointestinal: Positive for constipation. Negative for nausea, vomiting, abdominal pain and diarrhea.  Genitourinary: Negative for dysuria and urgency.  Musculoskeletal: Positive for joint pain. Negative for myalgias.  Neurological: Negative for dizziness, sensory change, speech change, focal weakness, seizures and headaches.  Psychiatric/Behavioral: Negative for depression. The patient is not nervous/anxious.     DRUG ALLERGIES:   Allergies  Allergen Reactions  . Penicillins     VITALS:  Blood pressure 132/73, pulse 80, temperature 98.3 F (36.8 C), temperature source Oral, resp. rate 16, height 5\' 11"  (1.803 m), weight 106.369 kg (234 lb 8 oz), SpO2 99 %.  PHYSICAL EXAMINATION:  Physical Exam  GENERAL:  55 y.o.-year-old patient lying in the bed with no acute distress.  EYES: Pupils equal, round, reactive to light and accommodation. No scleral icterus. Extraocular muscles intact.  HEENT: Head atraumatic, normocephalic. Oropharynx and nasopharynx clear.  NECK:  Supple, no jugular venous distention. No thyroid  enlargement, no tenderness.  LUNGS: Normal breath sounds bilaterally, no wheezing, rales,rhonchi or crepitation. No use of accessory muscles of respiration. Decreased bibasilar breath sounds CARDIOVASCULAR: S1, S2 normal. No murmurs, rubs, or gallops. CABG changes on the chest. ABDOMEN: Soft, nontender, nondistended. Bowel sounds present. No organomegaly or mass.  EXTREMITIES: No pedal edema, cyanosis, or clubbing. Right foot second toe status post amputation, dressing in place and wound VAC in place. The first toe appears purplish and gangrenous. NEUROLOGIC: Cranial nerves II through XII are intact. Muscle strength 5/5 in all extremities. Sensation intact. Gait not checked.  PSYCHIATRIC: The patient is alert and oriented x 3.  SKIN: No obvious rash, lesion, or ulcer.    LABORATORY PANEL:   CBC  Recent Labs Lab 06/24/15 0500  WBC 9.2  HGB 8.4*  HCT 25.4*  PLT 223   ------------------------------------------------------------------------------------------------------------------  Chemistries   Recent Labs Lab 06/19/15 1929  06/24/15 0500  NA 137  < > 137  K 4.6  < > 3.7  CL 108  < > 108  CO2 22  < > 22  GLUCOSE 142*  < > 127*  BUN 21*  < > 18  CREATININE 1.24  < > 1.24  CALCIUM 8.5*  < > 7.9*  MG  --   --  1.8  AST 16  --   --   ALT 16*  --   --   ALKPHOS 69  --   --   BILITOT 0.5  --   --   < > = values in this interval not displayed. ------------------------------------------------------------------------------------------------------------------  Cardiac Enzymes No results for input(s): TROPONINI in the last 168 hours. ------------------------------------------------------------------------------------------------------------------  RADIOLOGY:  Dg  Chest Port 1 View  06/22/2015  CLINICAL DATA:  PICC placement.  Initial encounter. EXAM: PORTABLE CHEST 1 VIEW COMPARISON:  None. FINDINGS: The patient's right PICC is noted ending about the distal SVC. The lungs are  well-aerated and clear. There is no evidence of focal opacification, pleural effusion or pneumothorax. The cardiomediastinal silhouette is borderline normal in size. The patient is status post median sternotomy, with evidence of prior CABG. No acute osseous abnormalities are seen. There is chronic deformity of the right clavicle. IMPRESSION: 1. Right PICC noted ending about the distal SVC. 2. No acute cardiopulmonary process seen. Electronically Signed   By: Roanna RaiderJeffery  Chang M.D.   On: 06/22/2015 21:01    EKG:  No orders found for this or any previous visit.  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  - Status post second metatarsal and third metatarsal head and second toe amputation. First toe appears necrotic - Transmet amputation tomorrow - cultures with MSSA and stenotrophomonas.  - on ancef and Levaquin. Infectious disease consulted - Has a PICC line now and will need 2-4 weeks of IV ABX  #2 Gangrene of toe- vascular following- s/p angiogram and angioplasty to right peroneal - on asa and plavix  #3 [facility]- stable for now - on asa, plavix and statin - coreg and lisinopril were held due to low BP - but BP better, start coreg Has ischemic cardiomyopathy and last known EF of 30%  #4 diabetes mellitus-  home dose of 70/30 insulin and  Metformin are on hold now as sugars consistently on lower side -Currently only on sliding scale insulin -Monitor to restart medications - a1c is 6.7, sugars well controlled  #5 CKD stage III-stable at this  time monitor on antibiotics  #6 DVT prophylaxis-on Lovenox  #7 Constipation- meds added   Physical Therapy consulted, recommended rehab for now- likely early next week Discussed with mother at bedside   All the records are reviewed and case discussed with Care Management/Social Workerr. Management plans discussed with the patient, family and they are in agreement.  CODE STATUS: Full code  TOTAL TIME TAKING CARE OF THIS PATIENT: 33 minutes.   POSSIBLE D/C EARLY NEXT WEEK, DEPENDING ON CLINICAL CONDITION.   Guiselle Mian M.D on 06/24/2015 at 1:40 PM  Between 7am to 6pm - Pager - [phone]  After 6pm go to [email or web address] - password [email or web address] [facility]  [facility]  Office  [phone]  CC: Primary care physician; No PCP Per Patient

## 2015-06-24 NOTE — Progress Notes (Signed)
PT Cancellation Note  Patient Details Name: Edward Boyer Luther Null III MRN: [id number] DOB: [birth year]   Cancelled Treatment:    Reason Eval/Treat Not Completed: Patient declined, no reason specified;Other (comment), Treatment attempted this afternoon; pt with several visitors and refuses at this time; pt asks if PT can attempt later in the afternoon. Pt informed that we would try as the schedule allows. Pt is scheduled for surgery for further amputation tomorrow morning and will need new PT orders post surgery.    [address]Makai Dumond Elizabeth BloomingtonBishop, [place]PTA 06/24/2015, 2:08 PM

## 2015-06-24 NOTE — Progress Notes (Signed)
Daily Progress Note   Subjective  - 3 Days Post-Op  Planning for Trans-met amputation right foot tomorrow.  Objective Filed Vitals:   06/23/15 0948 06/23/15 2052 06/24/15 0500 06/24/15 1313  BP: 110/56 115/63  132/73  Pulse: 86 87  80  Temp: 98.7 F (37.1 C) 99.2 F (37.3 C)  98.3 F (36.8 C)  TempSrc: Oral   Oral  Resp: _0 Height:      Weight:   106.369 kg (234 lb 8 oz)   SpO2: 98% 97%  99%    Physical Exam: Gangrene to right great toe, 2nd toe has been amputated.  Small superficial blood blister at dorsal midfoot without infection.    Laboratory CBC    Component Value Date/Time   WBC 9.2 06/24/2015 0500   HGB 8.4* 06/24/2015 0500   HCT 25.4* 06/24/2015 0500   PLT 223 06/24/2015 0500    BMET    Component Value Date/Time   NA 137 06/24/2015 0500   K 3.7 06/24/2015 0500   CL 108 06/24/2015 0500   CO2 22 06/24/2015 0500   GLUCOSE 127* 06/24/2015 0500   BUN 18 06/24/2015 0500   CREATININE 1.24 06/24/2015 0500   CALCIUM 7.9* 06/24/2015 0500   GFRNONAA >60 06/24/2015 0500   GFRAA >60 06/24/2015 0500   Micro with Staph and Stenotrophomonas. Followed by ID. Assessment/Planning: Plan for transmet tomorrow for residual gangrene. NPO p midnight   D/W pt and family and consent given.    Samara Deist A  06/24/2015, 1:33 PM

## 2015-06-24 NOTE — Progress Notes (Signed)
[facility]  Daily Progress Note   Subjective: 3 Days Post-Op: Introduction catheter into right lower extremity 3rd order catheter placement, Contrast injection right lower extremity for distal runoff, Percutaneous transluminal angioplasty to 3 mm right peroneal, Star close closure left common femoral arteriotomy.  Patient nervous about surgery with podiatry tomorrow otherwise without complaint.   Objective: Filed Vitals:   06/22/15 2224 06/23/15 0948 06/23/15 2052 06/24/15 0500  BP:  110/56 115/63   Pulse:  86 87   Temp: 98.3 F (36.8 C) 98.7 F (37.1 C) 99.2 F (37.3 C)   TempSrc: Oral Oral    Resp:  20 19   Height:      Weight:    106.369 kg (234 lb 8 oz)  SpO2:  98% 97%     Intake/Output Summary (Last 24 hours) at 06/24/15 0758 Last data filed at 06/24/15 0425  Gross per 24 hour  Intake   2078 ml  Output   1650 ml  Net    428 ml    Physical Exam: A&Ox3, NAD CV: RRR Pulmonary: CTA Bilaterally Right Extremity: PICC in place. No swelling or drainage noted.  Abdomen: Soft, Nontender, Nondistended Vascular: Left Groin Access Site: OR dressing in place. Clean and Dry. No swelling or drainage noted. Right Lower Extremity: warm, VAC in place.First toe necrotic.   Laboratory: CBC    Component Value Date/Time   WBC 9.2 06/24/2015 0500   HGB 8.4* 06/24/2015 0500   HCT 25.4* 06/24/2015 0500   PLT 223 06/24/2015 0500    BMET    Component Value Date/Time   NA 137 06/24/2015 0500   K 3.7 06/24/2015 0500   CL 108 06/24/2015 0500   CO2 22 06/24/2015 0500   GLUCOSE 127* 06/24/2015 0500   BUN 18 06/24/2015 0500   CREATININE 1.24 06/24/2015 0500   CALCIUM 7.9* 06/24/2015 0500   GFRNONAA >60 06/24/2015 0500   GFRAA >60 06/24/2015 0500    Assessment/Planning: 55 year old male s/p 3 Day Post-Op: Introduction catheter into right lower extremity 3rd order catheter placement, Contrast injection right lower extremity for  distal runoff, Percutaneous transluminal angioplasty to 3 mm right peroneal, Star close closure left common femoral arteriotomy - stable 1) Successful recanalization right lower cavity for limb salvage 2) Continue VAC therapy as per podiatry 3) PICC line placed for long term ABX 4) OR tomorrow for right transmet amputation.  Cleda DaubKimberly Sanayah Munro PA-C 06/24/2015 7:58 AM

## 2015-06-25 ENCOUNTER — Encounter
Admission: AD | Disposition: A | Discharge status: Skilled Nursing Facility | Payer: Self-pay | Source: Other Acute Inpatient Hospital | Attending: Vascular Surgery

## 2015-06-25 ENCOUNTER — Inpatient Hospital Stay: Payer: Managed Care, Other (non HMO) | Admitting: Anesthesiology

## 2015-06-25 ENCOUNTER — Encounter: Payer: Self-pay | Admitting: Anesthesiology

## 2015-06-25 HISTORY — PX: TRANSMETATARSAL AMPUTATION: SHX6197

## 2015-06-25 LAB — GLUCOSE, CAPILLARY
GLUCOSE-CAPILLARY: 131 mg/dL — AB (ref 65–99)
GLUCOSE-CAPILLARY: 126 mg/dL — AB (ref 65–99)
GLUCOSE-CAPILLARY: 112 mg/dL — AB (ref 65–99)
GLUCOSE-CAPILLARY: 153 mg/dL — AB (ref 65–99)

## 2015-06-25 LAB — BASIC METABOLIC PANEL
Anion gap: 7 (ref 5–15)
BUN: 15 mg/dL (ref 6–20)
CO2: 22 mmol/L (ref 22–32)
CREATININE: 1.31 mg/dL — AB (ref 0.61–1.24)
Calcium: 8.3 mg/dL — ABNORMAL LOW (ref 8.9–10.3)
Chloride: 108 mmol/L (ref 101–111)
GFR calc Af Amer: >60 mL/min (ref >60)
GLUCOSE: 132 mg/dL — AB (ref 65–99)
POTASSIUM: 3.7 mmol/L (ref 3.5–5.1)
Sodium: 137 mmol/L (ref 135–145)

## 2015-06-25 LAB — AEROBIC/ANAEROBIC CULTURE W GRAM STAIN (SURGICAL/DEEP WOUND): Gram Stain: NONE SEEN

## 2015-06-25 SURGERY — AMPUTATION, FOOT, TRANSMETATARSAL
Anesthesia: General | Laterality: Right | Wound class: Clean Contaminated

## 2015-06-25 MED ORDER — BUPIVACAINE HCL 0.5 % IJ SOLN
INTRAMUSCULAR | Status: DC | PRN
  Administered 2015-06-25: 20 mL

## 2015-06-25 MED ORDER — FENTANYL CITRATE (PF) 100 MCG/2ML IJ SOLN
INTRAMUSCULAR | Status: DC | PRN
  Administered 2015-06-25: 50 ug via INTRAVENOUS
  Administered 2015-06-25 (×2): 25 ug via INTRAVENOUS

## 2015-06-25 MED ORDER — LIDOCAINE HCL (CARDIAC) 20 MG/ML IV SOLN
INTRAVENOUS | Status: DC | PRN
  Administered 2015-06-25: 20 mg via INTRAVENOUS

## 2015-06-25 MED ORDER — METHYLNALTREXONE BROMIDE 12 MG/0.6ML ~~LOC~~ SOLN
8.0000 mg | Freq: Once | SUBCUTANEOUS | Status: AC
  Administered 2015-06-25: 8 mg via SUBCUTANEOUS
  Filled 2015-06-25: qty 0.6

## 2015-06-25 MED ORDER — GLYCOPYRROLATE 0.2 MG/ML IJ SOLN
INTRAMUSCULAR | Status: DC | PRN
  Administered 2015-06-25: .2 mg via INTRAVENOUS

## 2015-06-25 MED ORDER — PHENYLEPHRINE HCL 10 MG/ML IJ SOLN
INTRAMUSCULAR | Status: DC | PRN
  Administered 2015-06-25: 150 ug via INTRAVENOUS
  Administered 2015-06-25 (×2): 100 ug via INTRAVENOUS
  Administered 2015-06-25: 50 ug via INTRAVENOUS
  Administered 2015-06-25 (×7): 100 ug via INTRAVENOUS

## 2015-06-25 MED ORDER — ONDANSETRON HCL 4 MG/2ML IJ SOLN: 4.0000 mg | Freq: Once | INTRAMUSCULAR | Status: DC | PRN

## 2015-06-25 MED ORDER — FENTANYL CITRATE (PF) 100 MCG/2ML IJ SOLN: 25.0000 ug | INTRAMUSCULAR | Status: DC | PRN

## 2015-06-25 MED ORDER — MIDAZOLAM HCL 2 MG/2ML IJ SOLN
INTRAMUSCULAR | Status: DC | PRN
  Administered 2015-06-25: 2 mg via INTRAVENOUS

## 2015-06-25 MED ORDER — PROPOFOL 10 MG/ML IV BOLUS
INTRAVENOUS | Status: DC | PRN
  Administered 2015-06-25: 90 mg via INTRAVENOUS

## 2015-06-25 SURGICAL SUPPLY — 43 items
BANDAGE ELASTIC 4 LF NS (GAUZE/BANDAGES/DRESSINGS) ×2 IMPLANT
BANDAGE STRETCH 3X4.1 STRL (GAUZE/BANDAGES/DRESSINGS) ×2 IMPLANT
BLADE OSCILLATING/SAGITTAL (BLADE) ×1
BLADE SAW GIGLI 510 (BLADE) IMPLANT
BLADE SW THK.38XMED LNG THN (BLADE) ×1 IMPLANT
BNDG COHESIVE 4X5 TAN STRL (GAUZE/BANDAGES/DRESSINGS) ×2 IMPLANT
BNDG ESMARK 4X12 TAN STRL LF (GAUZE/BANDAGES/DRESSINGS) ×2 IMPLANT
BNDG GAUZE 4.5X4.1 6PLY STRL (MISCELLANEOUS) ×2 IMPLANT
CANISTER SUCT 1200ML W/VALVE (MISCELLANEOUS) ×2 IMPLANT
CUFF TOURN 18 STER (MISCELLANEOUS) ×2 IMPLANT
CUFF TOURN DUAL PL 12 NO SLV (MISCELLANEOUS) IMPLANT
DRAIN PENROSE 1/4X12 LTX (DRAIN) IMPLANT
DRAPE FLUOR MINI C-ARM 54X84 (DRAPES) ×2 IMPLANT
DRESSING ALLEVYN 4X4 (MISCELLANEOUS) IMPLANT
DURAPREP 26ML APPLICATOR (WOUND CARE) ×2 IMPLANT
ELECT REM PT RETURN 9FT ADLT (ELECTROSURGICAL) ×2
ELECTRODE REM PT RTRN 9FT ADLT (ELECTROSURGICAL) ×1 IMPLANT
GAUZE PETRO XEROFOAM 1X8 (MISCELLANEOUS) ×2 IMPLANT
GAUZE SPONGE 4X4 12PLY STRL (GAUZE/BANDAGES/DRESSINGS) ×2 IMPLANT
GLOVE BIO SURGEON STRL SZ7.5 (GLOVE) ×2 IMPLANT
GLOVE INDICATOR 8.0 STRL GRN (GLOVE) ×2 IMPLANT
GOWN STRL REUS W/ TWL LRG LVL3 (GOWN DISPOSABLE) ×2 IMPLANT
GOWN STRL REUS W/TWL LRG LVL3 (GOWN DISPOSABLE) ×2
HANDLE YANKAUER SUCT BULB TIP (MISCELLANEOUS) IMPLANT
HANDPIECE VERSAJET DEBRIDEMENT (MISCELLANEOUS) ×2 IMPLANT
KIT RM TURNOVER STRD PROC AR (KITS) ×2 IMPLANT
LABEL OR SOLS (LABEL) IMPLANT
NDL SAFETY 18GX1.5 (NEEDLE) ×2 IMPLANT
NS IRRIG 500ML POUR BTL (IV SOLUTION) ×2 IMPLANT
PACK EXTREMITY ARMC (MISCELLANEOUS) ×2 IMPLANT
PENCIL ELECTRO HAND CTR (MISCELLANEOUS) IMPLANT
PREP PVP WINGED SPONGE (MISCELLANEOUS) IMPLANT
SOL PREP PVP 2OZ (MISCELLANEOUS)
SOLUTION PREP PVP 2OZ (MISCELLANEOUS) IMPLANT
SPONGE LAP 18X18 5 PK (GAUZE/BANDAGES/DRESSINGS) IMPLANT
SPONGE XRAY 4X4 16PLY STRL (MISCELLANEOUS) ×4 IMPLANT
STOCKINETTE M/LG 89821 (MISCELLANEOUS) ×2 IMPLANT
SUT ETHILON 2 0 FS 18 (SUTURE) ×2 IMPLANT
SUT ETHILON 2 0 FSLX (SUTURE) ×2 IMPLANT
SUT ETHILON 3-0 FS-10 30 BLK (SUTURE) ×2
SUT VIC AB 2-0 CT1 36 (SUTURE) ×2 IMPLANT
SUTURE EHLN 3-0 FS-10 30 BLK (SUTURE) ×1 IMPLANT
SYRINGE 10CC LL (SYRINGE) IMPLANT

## 2015-06-25 NOTE — Progress Notes (Signed)
Infectious Disease Long Term IV Antibiotic Orders  Diagnosis:PVD and gangrene Sp TMA 6;23  Culture results MSSA and stentotrophomonas  Allergies:  Allergies  Allergen Reactions  . Penicillins     Discharge antibiotics Cefazolin    1   grams every  8 hours Levofloxacin 750 po qd  PICC Care per protocol Labs weekly while on IV antibiotics      CBC w diff   Comprehensive met panel  CRP    Planned duration of antibiotics 2 weeks from 6/23  Stop date 7/7  Follow up clinic date TBD  FAX weekly labs to [phone]  Leonel Ramsay, MD

## 2015-06-25 NOTE — Op Note (Signed)
Operative note   Surgeon:Joni Colegrove Lawyer: None    Preop diagnosis: Gangrene with osteomyelitis right forefoot    Postop diagnosis: Same    Procedure: Transmetatarsal amputation right foot    EBL: Less than 100 ML's    Anesthesia:local and general    Hemostasis: None    Specimen: Transmetatarsal amputation right foot    Complications: None    Operative indications:Edward Boyer is an 55 y.o. that presents today for surgical intervention.  The risks/benefits/alternatives/complications have been discussed and consent has been given.    Procedure:  Patient was brought into the OR and placed on the operating table in thesupine position. After anesthesia was obtained theright lower extremity was prepped and draped in usual sterile fashion.  There was noted be severe gangrenous changes of the right great toe to the plantar aspect of the first metatarsal head encompassing the entire plantar surface of the first metatarsal head. Patient previously undergone second toe amputation and second ray with an open wound. There was early necrotic changes in the soft tissue at the metatarsophalangeal joint grossly. A fishmouth incision was begun at the medial aspect of the first metatarsal coursing dorsally over the lesser metatarsophalangeal joints. The plantar flap was created coursing under the third fourth and fifth metatarsal heads and excising the necrotic tissue from the second toe amputation site and necrotic plantar first metatarsal head. Full-thickness incision was made. This was taken down to bone. Next osteotomies were created through the metatarsals 1 through 5 to remove the distal transmetatarsal amputation site. This was removed from the surgical field in toto. Further revisions of the metatarsal cuts were performed until closure of the wound flap was created. Debulking of the necrotic tissue was performed. The lateral one half of the foot had excellent perfusion. The  medial one half had perfusion with some decreased areas of perfusion. Some areas of necrotic tissue in the subcutaneous tissue were noted under the third metatarsal as well as the second and first metatarsals as expected. A small dorsal abscess was noted and opened and debrided this was overlying the second metatarsal to the mid foot region. All areas were flushed out and revision of the plantar flap was performed with the use of a versa jet and all areas were debrided with the versa jet excisionally. At this time layered skin closure was performed with a 2-0 Vicryl for the deeper layer and a 2-0 nylon for the skin. I was able to perform a primary closure of all wounds flaps at this time. A well compressive large sterile bulky dressing was applied. There was noted be a small superficial abrasion of the skin overlying the first met cuneiform joint region. This was protected with a padded bandaging. We'll have to monitor this area of skin as well.    Patient tolerated the procedure and anesthesia well.  Was transported from the OR to the PACU with all vital signs stable and vascular status intact. We'll be readmitted to the floor. We'll likely be discharged early next week for skilled nursing placement.

## 2015-06-25 NOTE — Progress Notes (Signed)
Pharmacy Antibiotic Note  Edward Boyer is a 55 y.o. 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  Pharmacy has been consulted for Ancef and levofloxacin dosing.  Plan: Continue Cefazolin 1 g IV q8 hours, levofloxacin 750mg  q 24 hours  Height: 5\' 11"  (180.3 cm) Weight: 226 lb 6.4 oz (102.694 kg) IBW/kg (Calculated) : 75.3  Temp (24hrs), Avg:98 F (36.7 C), Min:97.6 F (36.4 C), Max:99.2 F (37.3 C)   Recent Labs Lab 06/19/15 1929 06/19/15 1935 06/20/15 0448 06/21/15 0424 06/21/15 2042 06/22/15 0455 06/23/15 0615 06/23/15 0934 06/24/15 0500 06/25/15 0500  WBC 12.7*  --  12.2* 11.0*  --   --  9.0  --  9.2  --   CREATININE 1.24  --   --  1.45*  --  1.28* 1.34*  --  1.24 1.31*  VANCOTROUGH  --   --   --   --  13  --   --  22*  --   --   VANCORANDOM  --  12  --   --   --   --   --   --   --   --     Estimated Creatinine Clearance: 79.6 mL/min (by C-G formula based on Cr of 1.31).    Allergies  Allergen Reactions  . Penicillins     Antimicrobials this admission: Levofloxacin  6/18 >>    >>   Dose adjustments this admission:  Microbiology results: Recent Results (from the past 240 hour(s))  Aerobic/Anaerobic Culture (surgical/deep wound)     Status: None   Collection Time: 06/19/15  8:50 PM  Result Value Ref Range Status   Specimen Description ULCER  Final   Special Requests Immunocompromised  Final   Gram Stain   Final    NO WBC SEEN RARE SQUAMOUS EPITHELIAL CELLS PRESENT RARE GRAM VARIABLE ROD RARE GRAM POSITIVE COCCI    Culture   Final    MODERATE STAPHYLOCOCCUS AUREUS MODERATE STENOTROPHOMONAS MALTOPHILIA SUSCEPTIBILITIES PERFORMED ON PREVIOUS CULTURE WITHIN THE LAST 5 DAYS. NO ANAEROBES ISOLATED Performed at [facility]    Report Status 06/25/2015 FINAL  Final  Surgical pcr screen     Status: None   Collection Time: 06/19/15 11:05 PM  Result Value Ref Range Status   MRSA, PCR NEGATIVE  NEGATIVE Final   Staphylococcus aureus NEGATIVE NEGATIVE Final    Comment:        The Xpert SA Assay (FDA approved for NASAL specimens in patients over 53 years of age), is one component of a comprehensive surveillance program.  Test performance has been validated by [facility] for patients greater than or equal to 53 year old. It is not intended to diagnose infection nor to guide or monitor treatment.   Aerobic/Anaerobic Culture (surgical/deep wound)     Status: None   Collection Time: 06/20/15  1:55 PM  Result Value Ref Range Status   Specimen Description FOOT ABSCESS RIGHT  Final   Special Requests NONE  Final   Gram Stain   Final    FEW WBC PRESENT, PREDOMINANTLY PMN NO SQUAMOUS EPITHELIAL CELLS SEEN FEW GRAM POSITIVE COCCI IN PAIRS    Culture   Final    FEW STAPHYLOCOCCUS AUREUS FEW STENOTROPHOMONAS MALTOPHILIA NO ANAEROBES ISOLATED Performed at [facility]    Report Status 06/25/2015 FINAL  Final   Organism ID, Bacteria STENOTROPHOMONAS MALTOPHILIA  Final   Organism ID, Bacteria STAPHYLOCOCCUS AUREUS  Final  Susceptibility   Staphylococcus aureus - MIC*    CIPROFLOXACIN <=0.5 SENSITIVE Sensitive     ERYTHROMYCIN 2 RESISTANT Resistant     GENTAMICIN <=0.5 SENSITIVE Sensitive     OXACILLIN <=0.25 SENSITIVE Sensitive     TETRACYCLINE <=1 SENSITIVE Sensitive     VANCOMYCIN 1 SENSITIVE Sensitive     TRIMETH/SULFA <=10 SENSITIVE Sensitive     CLINDAMYCIN <=0.25 RESISTANT Resistant     RIFAMPIN <=0.5 SENSITIVE Sensitive     Inducible Clindamycin Value in next row Resistant      POSITIVEINDUCIBLE CLINDAMYCIN RESISTANCE - A positive ICR test is indicative of inducible resistance to macrolides, lincosamides, and type B streptogramin.  This isolate is presumed to be resistant to Clindamycin, however, Clindamycin may still be effective in some patients.     * FEW STAPHYLOCOCCUS AUREUS   Stenotrophomonas maltophilia - MIC*    LEVOFLOXACIN Value in next row  Sensitive      POSITIVEINDUCIBLE CLINDAMYCIN RESISTANCE - A positive ICR test is indicative of inducible resistance to macrolides, lincosamides, and type B streptogramin.  This isolate is presumed to be resistant to Clindamycin, however, Clindamycin may still be effective in some patients.     TRIMETH/SULFA Value in next row Sensitive      POSITIVEINDUCIBLE CLINDAMYCIN RESISTANCE - A positive ICR test is indicative of inducible resistance to macrolides, lincosamides, and type B streptogramin.  This isolate is presumed to be resistant to Clindamycin, however, Clindamycin may still be effective in some patients.     * FEW STENOTROPHOMONAS MALTOPHILIA   .  Edward FlossMelissa D Glenis Boyer 06/25/2015 2:23 PM

## 2015-06-25 NOTE — Anesthesia Preprocedure Evaluation (Addendum)
Anesthesia Evaluation  Patient identified by MRN, date of birth, ID band Patient awake    Reviewed: Allergy & Precautions, NPO status , Patient's Chart, lab work & pertinent test results, reviewed documented beta blocker date and time   Airway Mallampati: II  TM Distance: >3 FB     Dental  (+) Chipped   Pulmonary           Cardiovascular hypertension, Pt. on medications and Pt. on home beta blockers + CAD, + Past MI, + CABG and + Peripheral Vascular Disease       Neuro/Psych CVA    GI/Hepatic   Endo/Other  diabetes, Type 2  Renal/GU      Musculoskeletal   Abdominal   Peds  Hematology   Anesthesia Other Findings   Reproductive/Obstetrics                            Anesthesia Physical Anesthesia Plan  ASA: III  Anesthesia Plan: General   Post-op Pain Management:    Induction: Intravenous  Airway Management Planned: LMA  Additional Equipment:   Intra-op Plan:   Post-operative Plan:   Informed Consent: I have reviewed the patients History and Physical, chart, labs and discussed the procedure including the risks, benefits and alternatives for the proposed anesthesia with the patient or authorized representative who has indicated his/her understanding and acceptance.     Plan Discussed with: CRNA  Anesthesia Plan Comments:        Anesthesia Quick Evaluation

## 2015-06-25 NOTE — Progress Notes (Addendum)
[facility] [place] [facility]   PATIENT NAME: Edward Boyer    MR#:  [id number]  DATE OF BIRTH:  1960-01-26  SUBJECTIVE:  CHIEF COMPLAINT:  No chief complaint on file.  - s/p right foot transmet amputation today, denies any complaints - has been constipated - also had angiogram and angioplasty to the leg - PICC line placed for long term IV ABX  REVIEW OF SYSTEMS:  Review of Systems  Constitutional: Negative for fever, chills and malaise/fatigue.  HENT: Negative for ear discharge, ear pain and nosebleeds.   Eyes: Negative for blurred vision and double vision.  Respiratory: Negative for cough, shortness of breath and wheezing.   Cardiovascular: Negative for chest pain, palpitations and leg swelling.  Gastrointestinal: Positive for constipation. Negative for nausea, vomiting, abdominal pain and diarrhea.  Genitourinary: Negative for dysuria and urgency.  Musculoskeletal: Positive for joint pain. Negative for myalgias.  Neurological: Negative for dizziness, sensory change, speech change, focal weakness, seizures and headaches.  Psychiatric/Behavioral: Negative for depression. The patient is not nervous/anxious.     DRUG ALLERGIES:   Allergies  Allergen Reactions  . Penicillins     VITALS:  Blood pressure 126/67, pulse 79, temperature 97.7 F (36.5 C), temperature source Oral, resp. rate 16, height 5\' 11"  (1.803 m), weight 102.694 kg (226 lb 6.4 oz), SpO2 99 %.  PHYSICAL EXAMINATION:  Physical Exam  GENERAL:  55 y.o.-year-old patient lying in the bed with no acute distress.  EYES: Pupils equal, round, reactive to light and accommodation. No scleral icterus. Extraocular muscles intact.  HEENT: Head atraumatic, normocephalic. Oropharynx and nasopharynx clear.  NECK:  Supple, no jugular venous distention. No thyroid enlargement, no tenderness.  LUNGS: Normal breath sounds bilaterally, no wheezing, rales,rhonchi or crepitation. No use of accessory  muscles of respiration. Decreased bibasilar breath sounds CARDIOVASCULAR: S1, S2 normal. No murmurs, rubs, or gallops. CABG changes on the chest. ABDOMEN: Soft, nontender, nondistended. Bowel sounds present. No organomegaly or mass.  EXTREMITIES: No pedal edema, cyanosis, or clubbing. Right foot second toe status post amputation, dressing in place and wound VAC in place. The first toe appears purplish and gangrenous. NEUROLOGIC: Cranial nerves II through XII are intact. Muscle strength 5/5 in all extremities. Sensation intact. Gait not checked.  PSYCHIATRIC: The patient is alert and oriented x 3.  SKIN: No obvious rash, lesion, or ulcer.    LABORATORY PANEL:   CBC  Recent Labs Lab 06/24/15 0500  WBC 9.2  HGB 8.4*  HCT 25.4*  PLT 223   ------------------------------------------------------------------------------------------------------------------  Chemistries   Recent Labs Lab 06/19/15 1929  06/24/15 0500 06/25/15 0500  NA 137  < > 137 137  K 4.6  < > 3.7 3.7  CL 108  < > 108 108  CO2 22  < > 22 22  GLUCOSE 142*  < > 127* 132*  BUN 21*  < > 18 15  CREATININE 1.24  < > 1.24 1.31*  CALCIUM 8.5*  < > 7.9* 8.3*  MG  --   --  1.8  --   AST 16  --   --   --   ALT 16*  --   --   --   ALKPHOS 69  --   --   --   BILITOT 0.5  --   --   --   < > = values in this interval not displayed. ------------------------------------------------------------------------------------------------------------------  Cardiac Enzymes No results for input(s): TROPONINI in the last 168 hours. ------------------------------------------------------------------------------------------------------------------  RADIOLOGY:  No results found.  EKG:  No orders found for this or any previous visit.  ASSESSMENT AND PLAN:   55 year old male with past medical history significant for diabetes mellitus, CAD status post CABG in February 2017, hypertension, peripheral neuropathy, CK D stage III presents  with right foot second toe gangrene and infection.  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  - on ancef and Levaquin. Infectious disease consulted - Has a PICC line now and will need 2-4 weeks of IV ABX - s/p angiogram and angioplasty to right peroneal this admission - on asa and plavix  #2 Anemia of chronic disease- stable and >8, no indication for transfusion for now Monitor  #3 CAD s/[facility]- stable for now - on asa, plavix and statin - coreg and lisinopril were held due to low BP - but BP better, start low dose coreg Has ischemic cardiomyopathy and last known EF of 30%  #4 diabetes mellitus-  home dose of 70/30 insulin and  Metformin are on hold now as sugars consistently on lower side -Currently only on sliding scale insulin -Monitor to restart medications - a1c is 6.7, sugars well controlled  #5 CKD stage III-stable at this time monitor on antibiotics  #6 DVT prophylaxis-on Lovenox  #7 Constipation- meds added   Physical Therapy consulted, recommended rehab for now- likely Monday Discussed with mother and sister at bedside   All the records are reviewed and case discussed with Care Management/Social Workerr. Management plans discussed with the patient, family and they are in agreement.  CODE STATUS: Full code  TOTAL TIME TAKING CARE OF THIS PATIENT: 34 minutes.   POSSIBLE D/C EARLY NEXT WEEK, DEPENDING ON CLINICAL CONDITION.   Enid BaasKALISETTI,Mollye Guinta M.D on 06/25/2015 at 1:43 PM  Between 7am to 6pm - Pager - [phone]  After 6pm go to [email or web address] - password [email or web address] [facility]  [facility]  Office  [phone]  CC: Primary care physician; No PCP Per Patient

## 2015-06-25 NOTE — Anesthesia Procedure Notes (Signed)
Procedure Name: LMA Insertion Date/Time: 06/25/2015 7:40 AM Performed by: Henrietta HooverPOPE, Tomoko Sandra Pre-anesthesia Checklist: Patient identified, Emergency Drugs available, Suction available, Patient being monitored and Timeout performed Patient Re-evaluated:Patient Re-evaluated prior to inductionOxygen Delivery Method: Circle system utilized Preoxygenation: Pre-oxygenation with 100% oxygen Intubation Type: IV induction Ventilation: Mask ventilation without difficulty LMA: LMA inserted LMA Size: 4.0 Number of attempts: 1 Tube secured with: Tape Dental Injury: Teeth and Oropharynx as per pre-operative assessment

## 2015-06-25 NOTE — H&P (Signed)
HISTORY AND PHYSICAL INTERVAL NOTE:  06/25/2015  7:26 AM  Edward Boyer  has presented today for surgery, with the diagnosis of right foot gangrene.  The various methods of treatment have been discussed with the patient.  No guarantees were given.  After consideration of risks, benefits and other options for treatment, the patient has consented to surgery.  I have reviewed the patients' chart and labs.    Patient Vitals for the past 24 hrs:  BP Temp Temp src Pulse Resp SpO2 Weight  06/25/15 0510 119/67 mmHg 98.2 F (36.8 C) Oral 81 20 100 % 102.694 kg (226 lb 6.4 oz)  06/24/15 2007 - - - 85 - 99 % -  06/24/15 2007 131/64 mmHg 99.2 F (37.3 C) Oral 85 (!) 24 99 % -  06/24/15 1313 132/73 mmHg 98.3 F (36.8 C) Oral 80 16 99 % -    Boyer history and physical examination has been performed and no changes from yesterday.  The patient was reexamined.  There have been no changes to this history and physical examination.  Edward Boyer, Edward Boyer

## 2015-06-25 NOTE — Transfer of Care (Signed)
Immediate Anesthesia Transfer of Care Note  Patient: Edward Boyer  Procedure(s) Performed: Procedure(s): RIGHT TRANSMETATARSAL AMPUTATION (Right)  Patient Location: PACU  Anesthesia Type:General  Level of Consciousness: sedated  Airway & Oxygen Therapy: Patient Spontanous Breathing and Patient connected to face mask oxygen  Post-op Assessment: Report given to RN and Post -op Vital signs reviewed and stable  Post vital signs: Reviewed and stable  Last Vitals:  Filed Vitals:   06/24/15 2007 06/25/15 0510  BP:  119/67  Pulse: 85 81  Temp:  36.8 C  Resp:  20    Last Pain:  Filed Vitals:   06/25/15 0631  PainSc: Asleep      Patients Stated Pain Goal: 0 (06/22/15 1110)  Complications: No apparent anesthesia complications

## 2015-06-25 NOTE — Progress Notes (Signed)
PT Cancellation Note  Patient Details Name: Edward Boyer MRN: [id number] DOB: 1960-06-13   Cancelled Treatment:    Reason Eval/Treat Not Completed: Patient at procedure or test/unavailable Pt with transmetatarsal amputation this morning.  Spoke with surgeon who requests to continue PT post surgery.  Will re-eval/treat pt tomorrow.  Malachi ProGalen R Loana Salvaggio 06/25/2015, 12:52 PM

## 2015-06-25 NOTE — Progress Notes (Signed)
Patient A/O, no noted distress. Unable to palpate pedal pulses RLE, wound vac in place. Patient npo after midnight. Tolerated meds well. Patient uses [facility]. Staff will continue to monitor and meet needs. Administered prn pain regimen, see MAR Flowsheet. Report was given to OR RN Brandy.

## 2015-06-25 NOTE — Brief Op Note (Signed)
06/19/2015 - 06/25/2015  9:23 AM  PATIENT:  Edward Boyer  55 y.o. male  PRE-OPERATIVE DIAGNOSIS:  Right Foot Gangrene  POST-OPERATIVE DIAGNOSIS:  Right Foot Gangrene  Procedure transmetatarsal amputation right foot.  Patient tolerated procedure well. A large bulky dressing has been applied. Will plan to change this over the weekend. The wound VAC was removed and will not be needed. Patient is to remain completely nonweightbearing to the right foot. Will need long-term IV antibiotics per infectious disease. Suspect will be stable for transfer to skilled nursing facility on Monday from podiatry standpoint.

## 2015-06-25 NOTE — Clinical Social Work Note (Signed)
CSW spoke with [place] at [facility] in [place], [place] and informed her that I had sent the updated PT, OP, and progress notes to add to their information for receiving prior auth from [place]. Zella BallRobin confirmed that they will begin auth immediately. York SpanielMonica Elmira Olkowski MSW,LCSW [phone]

## 2015-06-25 NOTE — Progress Notes (Signed)
[facility] PROGRESS NOTE Date of Admission:  06/19/2015     ID: Danielle DessMartin Luther Pangilinan III is a 55 y.o. 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)   Subjective: No fevers, s/p TMA  ROS  Eleven systems are reviewed and negative except per hpi  Medications:  Antibiotics Given (last 72 hours)    Date/Time Action Medication Dose Rate   06/22/15 1708 Given   levofloxacin (LEVAQUIN) IVPB 750 mg 750 mg 100 mL/hr   06/23/15 0105 Given   vancomycin (VANCOCIN) 1,500 mg in sodium chloride 0.9 % 500 mL IVPB 1,500 mg 250 mL/hr   06/23/15 1703 Given   levofloxacin (LEVAQUIN) IVPB 750 mg 750 mg 100 mL/hr   06/23/15 2212 Given   ceFAZolin (ANCEF) IVPB 1 g/50 mL premix 1 g 100 mL/hr   06/24/15 0601 Given   ceFAZolin (ANCEF) IVPB 1 g/50 mL premix 1 g 100 mL/hr   06/24/15 1721 Given   ceFAZolin (ANCEF) IVPB 1 g/50 mL premix 1 g 100 mL/hr   06/24/15 1946 Given   ceFAZolin (ANCEF) IVPB 1 g/50 mL premix 1 g 100 mL/hr   06/25/15 0227 Given   ceFAZolin (ANCEF) IVPB 1 g/50 mL premix 1 g 100 mL/hr   06/25/15 1039 Given   ceFAZolin (ANCEF) IVPB 1 g/50 mL premix 1 g 100 mL/hr     . aspirin EC  81 mg Oral Daily  . atorvastatin  80 mg Oral q1800  . carvedilol  3.125 mg Oral BID WC  .  ceFAZolin (ANCEF) IV  1 g Intravenous Q8H  . clopidogrel  75 mg Oral Daily  . docusate sodium  100 mg Oral BID  . enoxaparin (LOVENOX) injection  40 mg Subcutaneous Q24H  . insulin aspart  0-15 Units Subcutaneous TID WC  . levofloxacin  750 mg Oral Daily  . methylnaltrexone  8 mg Subcutaneous Once  . senna  2 tablet Oral BID    Objective: Vital signs in last 24 hours: Temp:  [97.6 F (36.4 C)-99.2 F (37.3 C)] 97.7 F (36.5 C) (06/23 1150) Pulse Rate:  [76-90] 79 (06/23 1150) Resp:  [12-24] 16 (06/23 1150) BP: (104-131)/(62-72) 126/67 mmHg (06/23 1150) SpO2:  [95 %-100 %] 99 % (06/23 1150) Weight:  [102.694 kg (226 lb 6.4 oz)]  102.694 kg (226 lb 6.4 oz) (06/23 0510) Constitutional: He is oriented to person, place, and time. He appears well-developed and well-nourished. No distress.  HENT: anicteric Mouth/Throat: Oropharynx is clear and moist. No oropharyngeal exudate.  Cardiovascular: Normal rate, regular rhythm and normal heart sounds.  Pulmonary/Chest: Effort normal and breath sounds normal. No respiratory distress. He has no wheezes.  Abdominal: Soft. Bowel sounds are normal. He exhibits no distension. There is no tenderness.  Lymphadenopathy: He has no cervical adenopathy.  Neurological: He is alert and oriented to person, place, and time.  Skin: RLE with post op wrap Psychiatric: He has a normal mood and affect. His behavior  normal.   Lab Results  Recent Labs  06/23/15 0615 06/24/15 0500 06/25/15 0500  WBC 9.0 9.2  --   HGB 8.0* 8.4*  --   HCT 23.9* 25.4*  --   NA 137 137 137  K 3.8 3.7 3.7  CL 109 108 108  CO2 22 22 22   BUN 17 18 15   CREATININE 1.34* 1.24 1.31*    Microbiology: Results for orders placed or performed during the hospital encounter of 06/19/15  Aerobic/Anaerobic  Culture (surgical/deep wound)     Status: None   Collection Time: 06/19/15  8:50 PM  Result Value Ref Range Status   Specimen Description ULCER  Final   Special Requests Immunocompromised  Final   Gram Stain   Final    NO WBC SEEN RARE SQUAMOUS EPITHELIAL CELLS PRESENT RARE GRAM VARIABLE ROD RARE GRAM POSITIVE COCCI    Culture   Final    MODERATE STAPHYLOCOCCUS AUREUS MODERATE STENOTROPHOMONAS MALTOPHILIA SUSCEPTIBILITIES PERFORMED ON PREVIOUS CULTURE WITHIN THE LAST 5 DAYS. NO ANAEROBES ISOLATED Performed at [facility]    Report Status 06/25/2015 FINAL  Final  Surgical pcr screen     Status: None   Collection Time: 06/19/15 11:05 PM  Result Value Ref Range Status   MRSA, PCR NEGATIVE NEGATIVE Final   Staphylococcus aureus NEGATIVE NEGATIVE Final    Comment:        The Xpert SA Assay  (FDA approved for NASAL specimens in patients over 53 years of age), is one component of a comprehensive surveillance program.  Test performance has been validated by [facility] for patients greater than or equal to 53 year old. It is not intended to diagnose infection nor to guide or monitor treatment.   Aerobic/Anaerobic Culture (surgical/deep wound)     Status: None   Collection Time: 06/20/15  1:55 PM  Result Value Ref Range Status   Specimen Description FOOT ABSCESS RIGHT  Final   Special Requests NONE  Final   Gram Stain   Final    FEW WBC PRESENT, PREDOMINANTLY PMN NO SQUAMOUS EPITHELIAL CELLS SEEN FEW GRAM POSITIVE COCCI IN PAIRS    Culture   Final    FEW STAPHYLOCOCCUS AUREUS FEW STENOTROPHOMONAS MALTOPHILIA NO ANAEROBES ISOLATED Performed at [facility]    Report Status 06/25/2015 FINAL  Final   Organism ID, Bacteria STENOTROPHOMONAS MALTOPHILIA  Final   Organism ID, Bacteria STAPHYLOCOCCUS AUREUS  Final      Susceptibility   Staphylococcus aureus - MIC*    CIPROFLOXACIN <=0.5 SENSITIVE Sensitive     ERYTHROMYCIN 2 RESISTANT Resistant     GENTAMICIN <=0.5 SENSITIVE Sensitive     OXACILLIN <=0.25 SENSITIVE Sensitive     TETRACYCLINE <=1 SENSITIVE Sensitive     VANCOMYCIN 1 SENSITIVE Sensitive     TRIMETH/SULFA <=10 SENSITIVE Sensitive     CLINDAMYCIN <=0.25 RESISTANT Resistant     RIFAMPIN <=0.5 SENSITIVE Sensitive     Inducible Clindamycin Value in next row Resistant      POSITIVEINDUCIBLE CLINDAMYCIN RESISTANCE - A positive ICR test is indicative of inducible resistance to macrolides, lincosamides, and type B streptogramin.  This isolate is presumed to be resistant to Clindamycin, however, Clindamycin may still be effective in some patients.     * FEW STAPHYLOCOCCUS AUREUS   Stenotrophomonas maltophilia - MIC*    LEVOFLOXACIN Value in next row Sensitive      POSITIVEINDUCIBLE CLINDAMYCIN RESISTANCE - A positive ICR test is indicative of inducible  resistance to macrolides, lincosamides, and type B streptogramin.  This isolate is presumed to be resistant to Clindamycin, however, Clindamycin may still be effective in some patients.     TRIMETH/SULFA Value in next row Sensitive      POSITIVEINDUCIBLE CLINDAMYCIN RESISTANCE - A positive ICR test is indicative of inducible resistance to macrolides, lincosamides, and type B streptogramin.  This isolate is presumed to be resistant to Clindamycin, however, Clindamycin may still be effective in some patients.     * FEW STENOTROPHOMONAS MALTOPHILIA  Studies/Results: No results found.  Assessment/Plan: Kejon Feild III is a 54 y.o. 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 He is now s/p surgical resction and revascularization but still with residual occlusion and also need for repeat surgery to remove gangrenous great toe. Culture from June 17 has MSSA, GNR. Culture from June 18 is MSSA and stentotrophomonas  MRSA PCR on admission was negative PICC placed  Recommendations Cont ancef for the MSSA - he has a hx of PCN causing a rash as a child but apppears to be tolerating ancef Continue levo for stentotrophomonas but can change to oral when needed Given severe PVD and infeciton would rec a 2 week course of IV abx followed by probable oral tail of treatment until wound healed Discussed with patient and filled out abx order sheet I will be out of town next week but can see in follow up in 2 weeks Thank you very much for the consult. Marland Kitchen  Elisha Mcgruder P   06/25/2015, 4:25 PM

## 2015-06-25 NOTE — Anesthesia Postprocedure Evaluation (Signed)
Anesthesia Post Note  Patient: Edward Boyer  Procedure(s) Performed: Procedure(s) (LRB): RIGHT TRANSMETATARSAL AMPUTATION (Right)  Patient location during evaluation: PACU Anesthesia Type: General Level of consciousness: awake and alert Pain management: pain level controlled Vital Signs Assessment: post-procedure vital signs reviewed and stable Respiratory status: spontaneous breathing, nonlabored ventilation, respiratory function stable and patient connected to nasal cannula oxygen Cardiovascular status: blood pressure returned to baseline and stable Postop Assessment: no signs of nausea or vomiting Anesthetic complications: no    Last Vitals:  Filed Vitals:   06/25/15 1010 06/25/15 1052  BP: 117/66 128/72  Pulse: 76 82  Temp: 36.6 C 36.5 C  Resp: 16     Last Pain:  Filed Vitals:   06/25/15 1159  PainSc: 0-No pain                 [name]

## 2015-06-26 LAB — BASIC METABOLIC PANEL
Anion gap: 7 (ref 5–15)
BUN: 16 mg/dL (ref 6–20)
CHLORIDE: 106 mmol/L (ref 101–111)
CO2: 23 mmol/L (ref 22–32)
CREATININE: 1.28 mg/dL — AB (ref 0.61–1.24)
Calcium: 7.9 mg/dL — ABNORMAL LOW (ref 8.9–10.3)
GFR calc non Af Amer: >60 mL/min (ref >60)
Glucose, Bld: 135 mg/dL — ABNORMAL HIGH (ref 65–99)
POTASSIUM: 3.7 mmol/L (ref 3.5–5.1)
Sodium: 136 mmol/L (ref 135–145)

## 2015-06-26 LAB — CBC
HEMATOCRIT: 24.8 % — AB (ref 40.0–52.0)
Hemoglobin: 8.3 g/dL — ABNORMAL LOW (ref 13.0–18.0)
MCH: 25.6 pg — AB (ref 26.0–34.0)
MCHC: 33.6 g/dL (ref 32.0–36.0)
MCV: 76.3 fL — AB (ref 80.0–100.0)
PLATELETS: 260 10*3/uL (ref 150–440)
RBC: 3.26 MIL/uL — AB (ref 4.40–5.90)
RDW: 17.6 % — ABNORMAL HIGH (ref 11.5–14.5)
WBC: 9.2 10*3/uL (ref 3.8–10.6)

## 2015-06-26 LAB — GLUCOSE, CAPILLARY
Glucose-Capillary: 128 mg/dL — ABNORMAL HIGH (ref 65–99)
Glucose-Capillary: 141 mg/dL — ABNORMAL HIGH (ref 65–99)
Glucose-Capillary: 104 mg/dL — ABNORMAL HIGH (ref 65–99)
Glucose-Capillary: 133 mg/dL — ABNORMAL HIGH (ref 65–99)

## 2015-06-26 LAB — MAGNESIUM: Magnesium: 2 mg/dL (ref 1.7–2.4)

## 2015-06-26 NOTE — Evaluation (Signed)
Physical Therapy Re-Evaluation Patient Details Name: Edward Boyer Luther Emma III MRN: [id number] DOB: March 15, 1960 Today's Date: 06/26/2015   History of Present Illness  Pt is a 55 y.o. male presenting to hospital with gangrene R 2nd toe (recent puncture wound with foreign body nail) and [facility].  Pt admitted with atherosclerotic occlusive disease B LE's associated with gangrene R 2nd toe.  Pt s/p R 2nd toe and metatarsal head and R 3rd metatarsal head amputation d/t osteomyelitis and also debridement d/t abscess R great toe 06/19/17 with wound vac placement.  Had R foot transmetatarsal amputation 6/23.  PMH includes DM, MI, CAD, htn, PVD, stroke, CABG (in February).  Clinical Impression  Pt did well with mobility and ambulation and was able to keep weight off R LE the entire time.  He initially had some hesitancy and guarding, but ultimately was able to ambulate in-home distances and responded well to gait training in this regard as well as training/edu on negotiating steps without hand rails.  Pt appears to be able to return home safely with assist from his mother, though there is still a possibility the rehab is more appropriate.  TBD per progress.    Follow Up Recommendations Home health PT;SNF (per progress)    Equipment Recommendations  Rolling walker with 5" wheels    Recommendations for Other Services       Precautions / Restrictions Precautions Precautions: Fall Restrictions Weight Bearing Restrictions: Yes RLE Weight Bearing: Non weight bearing      Mobility  Bed Mobility Overal bed mobility: Independent             General bed mobility comments:  (Pt is able to get to EOB w/o direct assist)  Transfers Overall transfer level: Modified independent Equipment used: Rolling walker (2 wheeled) Transfers: Sit to/from Stand Sit to Stand: Supervision         General transfer comment: multiple bouts of sit to stand, pt shows good  effort,safety  Ambulation/Gait Ambulation/Gait assistance: Supervision Ambulation Distance (Feet): 50 Feet Assistive device: Rolling walker (2 wheeled)       General Gait Details: Pt is able to ambulate safely but with inconsistent cadence.  He at time takes only very small shuffling steps, and at other times takes appropriate hopping steps with good confidence.  He has no LOBs or other safety concerns and does not have excessive fatigue with the effort.   Stairs Stairs: Yes Stairs assistance: Supervision Stair Management: No rails;Backwards;With walker Number of Stairs: 1 General stair comments: Pt was able to negotiate up/down single step w/o assist though he does need some extra cuing and encouragement to perform successfully  Wheelchair Mobility    Modified Rankin (Stroke Patients Only)       Balance Overall balance assessment: Modified Independent                                           Pertinent Vitals/Pain Pain Assessment: 0-10 Pain Score: 2     Home Living Family/patient expects to be discharged to:: Private residence Living Arrangements: Parent Available Help at Discharge: Family Type of Home: House Home Access: Stairs to enter Entrance Stairs-Rails: None Entrance Stairs-Number of Steps: 1 vs 2 Home Layout: One level Home Equipment: Environmental consultantWalker - 2 wheels;Bedside commode;Grab bars - toilet;Grab bars - tub/shower      Prior Function Level of Independence: Independent  Comments: Pt had returned to work post heart surgery, typically is independent, etc     Hand Dominance        Extremity/Trunk Assessment   Upper Extremity Assessment: Overall WFL for tasks assessed           Lower Extremity Assessment: Overall WFL for tasks assessed (only minimal hesitancy with R LE testing)         Communication   Communication: No difficulties  Cognition Arousal/Alertness: Awake/alert Behavior During Therapy: WFL for tasks  assessed/performed Overall Cognitive Status: Within Functional Limits for tasks assessed                      General Comments      Exercises        Assessment/Plan    PT Assessment Patient needs continued PT services  PT Diagnosis Difficulty walking;Acute pain   PT Problem List    PT Treatment Interventions DME instruction;Gait training;Stair training;Functional mobility training;Therapeutic activities;Therapeutic exercise;Balance training;Patient/family education;Wheelchair mobility training   PT Goals (Current goals can be found in the Care Plan section) Acute Rehab PT Goals Patient Stated Goal: to be able to walk further PT Goal Formulation: With patient Time For Goal Achievement: 07/10/15 Potential to Achieve Goals: Good    Frequency 7X/week   Barriers to discharge        Co-evaluation               End of Session Equipment Utilized During Treatment: Gait belt Activity Tolerance: Patient tolerated treatment well Patient left: in chair;with call bell/phone within reach;with chair alarm set Nurse Communication: Mobility status;Precautions;Weight bearing status         Time: [id number] PT Time Calculation (min) (ACUTE ONLY): 25 min   Charges:   PT Evaluation $PT Re-evaluation: 1 Procedure PT Treatments $Gait Training: 8-22 mins   PT G Codes:        Malachi ProGalen R Lindie Roberson, DPT 06/26/2015, 10:43 AM

## 2015-06-26 NOTE — Progress Notes (Signed)
[facility] [place] [facility]   PATIENT NAME: Edward BlackwaterMartin Boyer    MR#:  [id number]  DATE OF BIRTH:  17-Aug-1960  SUBJECTIVE:  CHIEF COMPLAINT:  No chief complaint on file.  - s/p right foot transmet amputation today, denies any complaints - has been constipated - also had angiogram and angioplasty to the leg - PICC line placed for long term IV ABX  REVIEW OF SYSTEMS:  Review of Systems  Constitutional: Negative for fever, chills and malaise/fatigue.  HENT: Negative for ear discharge, ear pain and nosebleeds.   Eyes: Negative for blurred vision and double vision.  Respiratory: Negative for cough, shortness of breath and wheezing.   Cardiovascular: Negative for chest pain, palpitations and leg swelling.  Gastrointestinal: Positive for constipation. Negative for nausea, vomiting, abdominal pain and diarrhea.  Genitourinary: Negative for dysuria and urgency.  Musculoskeletal: Positive for joint pain. Negative for myalgias.  Neurological: Negative for dizziness, sensory change, speech change, focal weakness, seizures and headaches.  Psychiatric/Behavioral: Negative for depression. The patient is not nervous/anxious.     DRUG ALLERGIES:   Allergies  Allergen Reactions  . Penicillins     VITALS:  Blood pressure 103/67, pulse 78, temperature 98.4 F (36.9 C), temperature source Oral, resp. rate 16, height 5\' 11"  (1.803 m), weight 104.327 kg (230 lb), SpO2 98 %.  PHYSICAL EXAMINATION:  Physical Exam  GENERAL:  55 y.o.-year-old patient lying in the bed with no acute distress.  EYES: Pupils equal, round, reactive to light and accommodation. No scleral icterus. Extraocular muscles intact.  HEENT: Head atraumatic, normocephalic. Oropharynx and nasopharynx clear.  NECK:  Supple, no jugular venous distention. No thyroid enlargement, no tenderness.  LUNGS: Normal breath sounds bilaterally, no wheezing, rales,rhonchi or crepitation. No use of accessory muscles  of respiration. Decreased bibasilar breath sounds CARDIOVASCULAR: S1, S2 normal. No murmurs, rubs, or gallops. CABG changes on the chest. ABDOMEN: Soft, nontender, nondistended. Bowel sounds present. No organomegaly or mass.  EXTREMITIES: No pedal edema, cyanosis, or clubbing. Right foot dressing in place.  NEUROLOGIC: Cranial nerves II through XII are intact. Muscle strength 5/5 in all extremities. Sensation intact. Gait not checked.  PSYCHIATRIC: The patient is alert and oriented x 3.  SKIN: No obvious rash, lesion, or ulcer.    LABORATORY PANEL:   CBC  Recent Labs Lab 06/26/15 0519  WBC 9.2  HGB 8.3*  HCT 24.8*  PLT 260   ------------------------------------------------------------------------------------------------------------------  Chemistries   Recent Labs Lab 06/19/15 1929  06/26/15 0519  NA 137  < > 136  K 4.6  < > 3.7  CL 108  < > 106  CO2 22  < > 23  GLUCOSE 142*  < > 135*  BUN 21*  < > 16  CREATININE 1.24  < > 1.28*  CALCIUM 8.5*  < > 7.9*  MG  --   < > 2.0  AST 16  --   --   ALT 16*  --   --   ALKPHOS 69  --   --   BILITOT 0.5  --   --   < > = values in this interval not displayed. ------------------------------------------------------------------------------------------------------------------  Cardiac Enzymes No results for input(s): TROPONINI in the last 168 hours. ------------------------------------------------------------------------------------------------------------------  RADIOLOGY:  No results found.  EKG:  No orders found for this or any previous visit.  ASSESSMENT AND PLAN:   55 year old male with past medical history significant for diabetes mellitus, CAD status post CABG in February 2017, hypertension, peripheral neuropathy, CK  D stage III presents with right foot second toe gangrene and infection.  #1 Right forefoot osteomyelitis with abscess from recent foreign body and gangrene of second toe-appreciate vascular and podiatry  input - MRI of the foot revealing cellulitis, osteomyelitis, and abscesses on the right side.  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  - on ancef and Levaquin. Infectious disease consulted - Has a PICC line now and will need 2 weeks of IV ABX - s/p angiogram and angioplasty to right peroneal this admission - on asa and plavix  #2 Anemia of chronic disease- stable and >8, no indication for transfusion for now Monitor  #3 [facility]- stable for now - on asa, plavix and statin - coreg and lisinopril were held due to low BP - but BP better, start low dose coreg Has ischemic cardiomyopathy and last known EF of 30%  #4 diabetes mellitus-  home dose of 70/30 insulin and  Metformin are on hold now as sugars consistently on lower side -Currently only on sliding scale insulin -Monitor to restart medications - a1c is 6.7, sugars well controlled  #5 CKD stage III-stable at this time monitor on antibiotics  #6 DVT prophylaxis-on Lovenox  #7 Constipation- meds added   Physical Therapy consulted, recommended HHA with PT- Likely d/c tomorrow, once get final recommendation from podiatry.   All the records are reviewed and case discussed with Care Management/Social Workerr. Management plans discussed with the patient, family and they are in agreement.  CODE STATUS: Full code  TOTAL TIME TAKING CARE OF THIS PATIENT: 35 minutes.   POSSIBLE D/C EARLY NEXT WEEK, DEPENDING ON CLINICAL CONDITION.   Altamese DillingVACHHANI, Jakylan Ron M.D on 06/26/2015 at 3:16 PM  Between 7am to 6pm - Pager - [phone]  After 6pm go to [email or web address] - password [email or web address] [facility]  [facility]  Office  [phone]  CC: Primary care physician; No PCP Per Patient

## 2015-06-26 NOTE — Progress Notes (Signed)
[facility]  Daily Progress Note   Subjective: 1 Day Post-Op: Transmetatarsal amputation right foot (podiatry)  5 Day Post-op: Introduction catheter into right lower extremity 3rd order catheter placement, Contrast injection right lower extremity for distal runoff, Percutaneous transluminal angioplasty to 3 mm right peroneal, Star close closure left common femoral arteriotomy.  Patient with some amputation site pain however is being control with pain medication. Worked with PT today. Sitting in chair. In good spirits. Would like to be discharged to rehab when the time comes.   Objective: Filed Vitals:   06/25/15 2120 06/26/15 0500 06/26/15 0820 06/26/15 1207  BP: 114/55  119/68 103/67  Pulse: 85   78  Temp: 99.6 F (37.6 C)   98.4 F (36.9 C)  TempSrc: Oral   Oral  Resp: 16   16  Height:      Weight:  104.327 kg (230 lb)    SpO2: 98%   98%    Intake/Output Summary (Last 24 hours) at 06/26/15 1332 Last data filed at 06/26/15 0900  Gross per 24 hour  Intake    240 ml  Output    950 ml  Net   -710 ml    Physical Exam: A&Ox3, NAD CV: RRR Pulmonary: CTA Bilaterally Right Extremity: PICC in place. No swelling or drainage noted.  Abdomen: Soft, Nontender, Nondistended Vascular: Left Groin Access Site: Clean and Dry. No swelling or drainage noted. Right Lower Extremity: warm, OR dressing intact, clean and dry   Laboratory: CBC    Component Value Date/Time   WBC 9.2 06/26/2015 0519   HGB 8.3* 06/26/2015 0519   HCT 24.8* 06/26/2015 0519   PLT 260 06/26/2015 0519   BMET    Component Value Date/Time   NA 136 06/26/2015 0519   K 3.7 06/26/2015 0519   CL 106 06/26/2015 0519   CO2 23 06/26/2015 0519   GLUCOSE 135* 06/26/2015 0519   BUN 16 06/26/2015 0519   CREATININE 1.28* 06/26/2015 0519   CALCIUM 7.9* 06/26/2015 0519   GFRNONAA >60 06/26/2015 0519   GFRAA >60 06/26/2015 0519   Assessment/Planning: 55 year old male  s/p right lower extremity angiogram with intervention and right transmetatarsal amputation - stable 1) As per podiatry OR Op note: lateral one half of the foot had excellent perfusion. The medial half had perfusion with some decreased areas of perfusion." Hopefully patient will have enough profusion to heal amputation site. If showing signs of slow healing may have to re-angio in the future.  2) Wound care as per podiatry 3) Continue physical therapy.  4) Pain control 5) Will plan on dispo to rehab as this is what patient would like   Cleda DaubKimberly Madissen Wyse PA-C 06/26/2015 1:32 PM

## 2015-06-27 LAB — CBC
HEMATOCRIT: 23.8 % — AB (ref 40.0–52.0)
HEMOGLOBIN: 8 g/dL — AB (ref 13.0–18.0)
MCH: 25.5 pg — ABNORMAL LOW (ref 26.0–34.0)
MCHC: 33.5 g/dL (ref 32.0–36.0)
MCV: 76 fL — AB (ref 80.0–100.0)
Platelets: 246 10*3/uL (ref 150–440)
RBC: 3.13 MIL/uL — ABNORMAL LOW (ref 4.40–5.90)
RDW: 17.8 % — ABNORMAL HIGH (ref 11.5–14.5)
WBC: 9.6 10*3/uL (ref 3.8–10.6)

## 2015-06-27 LAB — GLUCOSE, CAPILLARY
GLUCOSE-CAPILLARY: 117 mg/dL — AB (ref 65–99)
GLUCOSE-CAPILLARY: 151 mg/dL — AB (ref 65–99)
GLUCOSE-CAPILLARY: 113 mg/dL — AB (ref 65–99)
Glucose-Capillary: 168 mg/dL — ABNORMAL HIGH (ref 65–99)

## 2015-06-27 LAB — BASIC METABOLIC PANEL
ANION GAP: 7 (ref 5–15)
BUN: 17 mg/dL (ref 6–20)
CHLORIDE: 108 mmol/L (ref 101–111)
CO2: 24 mmol/L (ref 22–32)
CREATININE: 1.21 mg/dL (ref 0.61–1.24)
Calcium: 8.1 mg/dL — ABNORMAL LOW (ref 8.9–10.3)
GFR calc non Af Amer: >60 mL/min (ref >60)
GLUCOSE: 133 mg/dL — AB (ref 65–99)
Potassium: 3.6 mmol/L (ref 3.5–5.1)
Sodium: 139 mmol/L (ref 135–145)

## 2015-06-27 LAB — MAGNESIUM: Magnesium: 1.9 mg/dL (ref 1.7–2.4)

## 2015-06-27 NOTE — Progress Notes (Signed)
[facility]  Daily Progress Note   Subjective: 2 Day Post-Op: Transmetatarsal amputation right foot (podiatry)  6 Day Post-op: Introduction catheter into right lower extremity 3rd order catheter placement, Contrast injection right lower extremity for distal runoff, Percutaneous transluminal angioplasty to 3 mm right peroneal, Star close closure left common femoral arteriotomy.  Patient sitting in chair, mother at bedside. Worked with PT this morning. Still with some amputation site pain - otherwise without complaint. Patient continues to request rehab at time of dispo - PT recommends home health vs SNF at this point.   Objective: Filed Vitals:   06/26/15 2039 06/27/15 0407 06/27/15 0804 06/27/15 1347  BP: 110/57  120/71 105/60  Pulse: 89  80 84  Temp: 98.5 F (36.9 C)  98.6 F (37 C) 98.5 F (36.9 C)  TempSrc: Oral  Oral Oral  Resp: 24  16 17   Height:      Weight:  104.237 kg (229 lb 12.8 oz)    SpO2: 98%  98% 99%    Intake/Output Summary (Last 24 hours) at 06/27/15 1428 Last data filed at 06/27/15 1300  Gross per 24 hour  Intake    720 ml  Output   2500 ml  Net  -1780 ml   Physical Exam: A&Ox3, NAD CV: RRR Pulmonary: CTA Bilaterally Right Extremity: PICC in place. No swelling or drainage noted.  Abdomen: Soft, Nontender, Nondistended Vascular: Left Groin Access Site: Clean and Dry. No swelling or drainage noted. Right Lower Extremity: warm, OR dressing intact, clean and dry   Laboratory: CBC    Component Value Date/Time   WBC 9.6 06/27/2015 0306   HGB 8.0* 06/27/2015 0306   HCT 23.8* 06/27/2015 0306   PLT 246 06/27/2015 0306   BMET    Component Value Date/Time   NA 139 06/27/2015 0306   K 3.6 06/27/2015 0306   CL 108 06/27/2015 0306   CO2 24 06/27/2015 0306   GLUCOSE 133* 06/27/2015 0306   BUN 17 06/27/2015 0306   CREATININE 1.21 06/27/2015 0306   CALCIUM 8.1* 06/27/2015 0306   GFRNONAA >60 06/27/2015 0306    GFRAA >60 06/27/2015 0306   Assessment/Planning: 55 year old male s/p right lower extremity angiogram with intervention and right transmetatarsal amputation - stable 1) As per podiatry OR Op note: lateral one half of the foot had excellent perfusion. The medial half had perfusion with some decreased areas of perfusion." Hopefully patient will have enough profusion to heal amputation site. If showing signs of slow healing may have to re-angio in the future.  2) Wound care as per podiatry 3) Continue physical therapy.  4) Pain control 5) Will plan on dispo to rehab as this is what patient would like   Cleda DaubKimberly Kamarion Zagami PA-C 06/27/2015 2:28 PM

## 2015-06-27 NOTE — Progress Notes (Signed)
Patient Demographics  Edward Boyer, is a 55 y.o. male   MRN: [id number]   DOB - April 01, 1960  Admit Date - 06/19/2015    Outpatient Primary MD for the patient is No PCP Per Patient  Consult requested in the Hospital by Renford DillsGregory G Schnier, MD, On 06/27/2015    With History of -  Past Medical History  Diagnosis Date  . Diabetes mellitus without complication (HCC)   . Myocardial infarction (HCC)   . Coronary artery disease   . Hypertension   . Peripheral vascular disease (HCC)   . Stroke [facility](HCC)       Past Surgical History  Procedure Laterality Date  . Coronary artery bypass graft    . Amputation toe Right 06/20/2015    Procedure: AMPUTATION TOE and application of wound vac;  Surgeon: Gwyneth RevelsJustin Fowler, DPM;  Location: [facility] ORS;  Service: Podiatry;  Laterality: Right;  . Irrigation and debridement foot Right 06/20/2015    Procedure: IRRIGATION AND DEBRIDEMENT FOOT;  Surgeon: Gwyneth RevelsJustin Fowler, DPM;  Location: [facility] ORS;  Service: Podiatry;  Laterality: Right;  . Peripheral vascular catheterization Right 06/21/2015    Procedure: Abdominal Aortogram w/Lower Extremity;  Surgeon: Renford DillsGregory G Schnier, MD;  Location: [facility] INVASIVE CV LAB;  Service: Cardiovascular;  Laterality: Right;  . Transmetatarsal amputation Right 06/25/2015    Procedure: RIGHT TRANSMETATARSAL AMPUTATION;  Surgeon: Gwyneth RevelsJustin Fowler, DPM;  Location: [facility] ORS;  Service: Podiatry;  Laterality: Right;    in for   No chief complaint on file.    HPI  Edward Boyer  is a 55 y.o. male, Patient is 2 days status post transmetatarsal amputation by Dr. Ether GriffinsFowler. Proximally 6 days status post angioplasty by Dr. Lorretta HarpSchneir.  Social History Social History  Substance Use Topics  . Smoking status: Never Smoker   . Smokeless tobacco: Not on file  . Alcohol Use: Not on file     Family  History Family History  Problem Relation Age of Onset  . CAD Father   . Diabetes Mellitus II Father      Prior to Admission medications   Medication Sig Start Date End Date Taking? Authorizing Provider  acidophilus (RISAQUAD) CAPS capsule Take by mouth 2 (two) times daily.   Yes Historical Provider, MD  aspirin 81 MG tablet Take 81 mg by mouth daily.   Yes Historical Provider, MD  atorvastatin (LIPITOR) 80 MG tablet Take 80 mg by mouth at bedtime.   Yes Historical Provider, MD  carvedilol (COREG) 25 MG tablet Take 25 mg by mouth 2 (two) times daily with a meal.   Yes Historical Provider, MD  clopidogrel (PLAVIX) 75 MG tablet Take 75 mg by mouth daily.   Yes Historical Provider, MD  insulin NPH-regular Human (NOVOLIN 70/30) (70-30) 100 UNIT/ML injection Inject 30 Units into the skin daily with breakfast.   Yes Historical Provider, MD  insulin NPH-regular Human (NOVOLIN 70/30) (70-30) 100 UNIT/ML injection Inject 10 Units into the skin daily with supper.   Yes Historical Provider, MD  lisinopril (PRINIVIL,ZESTRIL) 2.5 MG tablet Take 2.5 mg by mouth daily.   Yes Historical Provider, MD  metFORMIN (GLUCOPHAGE) 1000 MG tablet Take 1,000 mg by mouth daily before supper.  Yes Historical Provider, MD  nitroGLYCERIN (NITRODUR - DOSED IN MG/24 HR) 0.4 mg/hr patch Place 0.4 mg under the tongue every 5 (five) minutes x 3 doses as needed.   Yes Historical Provider, MD  oxyCODONE-acetaminophen (PERCOCET/ROXICET) 5-325 MG tablet Take 1 tablet by mouth every 4 (four) hours as needed for severe pain.   Yes Historical Provider, MD    Anti-infectives    Start     Dose/Rate Route Frequency Ordered Stop   06/24/15 1400  levofloxacin (LEVAQUIN) tablet 750 mg     750 mg Oral Daily 06/24/15 1345     06/23/15 2300  vancomycin (VANCOCIN) 1,500 mg in sodium chloride 0.9 % 500 mL IVPB  Status:  Discontinued     1,500 mg 250 mL/hr over 120 Minutes Intravenous Every 24 hours 06/23/15 1131 06/23/15 1530   06/23/15  1845  ceFAZolin (ANCEF) IVPB 1 g/50 mL premix     1 g 100 mL/hr over 30 Minutes Intravenous Every 8 hours 06/23/15 1830     06/21/15 2200  vancomycin (VANCOCIN) 1,500 mg in sodium chloride 0.9 % 500 mL IVPB  Status:  Discontinued     1,500 mg 250 mL/hr over 120 Minutes Intravenous Every 18 hours 06/21/15 2132 06/23/15 1131   06/21/15 1232  clindamycin (CLEOCIN) 300 MG/50ML IVPB    Comments:  PATTERSON, ALYSON: cabinet override      06/21/15 1232 06/21/15 1824   06/20/15 1800  levofloxacin (LEVAQUIN) IVPB 750 mg  Status:  Discontinued     750 mg 100 mL/hr over 90 Minutes Intravenous Every 24 hours 06/19/15 2005 06/24/15 1345   06/20/15 1200  clindamycin (CLEOCIN) IVPB 900 mg     900 mg 100 mL/hr over 30 Minutes Intravenous On call to O.R. 06/19/15 2119 06/20/15 1333   06/20/15 0400  vancomycin (VANCOCIN) IVPB 1000 mg/200 mL premix  Status:  Discontinued     1,000 mg 200 mL/hr over 60 Minutes Intravenous Every 12 hours 06/19/15 2005 06/19/15 2035   06/19/15 2100  vancomycin (VANCOCIN) 1,500 mg in sodium chloride 0.9 % 500 mL IVPB  Status:  Discontinued     1,500 mg 250 mL/hr over 120 Minutes Intravenous Every 24 hours 06/19/15 2035 06/21/15 2131   06/19/15 2000  vancomycin (VANCOCIN) 1,500 mg in sodium chloride 0.9 % 500 mL IVPB  Status:  Discontinued     1,500 mg 250 mL/hr over 120 Minutes Intravenous  Once 06/19/15 1958 06/19/15 2035   06/19/15 1945  levofloxacin (LEVAQUIN) IVPB 750 mg     750 mg 100 mL/hr over 90 Minutes Intravenous  Once 06/19/15 1938 06/19/15 2214      Scheduled Meds: . aspirin EC  81 mg Oral Daily  . atorvastatin  80 mg Oral q1800  . carvedilol  3.125 mg Oral BID WC  .  ceFAZolin (ANCEF) IV  1 g Intravenous Q8H  . clopidogrel  75 mg Oral Daily  . docusate sodium  100 mg Oral BID  . enoxaparin (LOVENOX) injection  40 mg Subcutaneous Q24H  . insulin aspart  0-15 Units Subcutaneous TID WC  . levofloxacin  750 mg Oral Daily  . senna  2 tablet Oral BID    Continuous Infusions:  PRN Meds:.acetaminophen **OR** acetaminophen, bisacodyl, lactulose, magnesium hydroxide, morphine injection, nitroGLYCERIN, ondansetron **OR** ondansetron (ZOFRAN) IV, oxyCODONE, sodium phosphate, sorbitol, zolpidem  Allergies  Allergen Reactions  . Penicillins     Physical Exam  Vitals  Blood pressure 120/71, pulse 80, temperature 98.6 F (37 C), temperature source Oral, resp.  rate 16, height  (1.803 m), weight 104.237 kg (229 lb 12.8 oz), SpO2 98 %.  Lower Extremity exam:Bandages clean dry and intact. Side leave the dressing intact. Patient sitting up in a chair at this point and states he has periodic minimal pain.   Data Review  CBC  Recent Labs Lab 06/21/15 0424 06/23/15 0615 06/24/15 0500 06/26/15 0519 06/27/15 0306  WBC 11.0* 9.0 9.2 9.2 9.6  HGB 8.8* 8.0* 8.4* 8.3* 8.0*  HCT 27.3* 23.9* 25.4* 24.8* 23.8*  PLT 244 225 223 260 246  MCV 77.3* 75.7* 75.9* 76.3* 76.0*  MCH 25.0* 25.2* 25.1* 25.6* 25.5*  MCHC 32.3 33.3 33.1 33.6 33.5  RDW 17.8* 18.0* 17.4* 17.6* 17.8*   ------------------------------------------------------------------------------------------------------------------  Chemistries   Recent Labs Lab 06/23/15 0615 06/24/15 0500 06/25/15 0500 06/26/15 0519 06/27/15 0306  NA 137 137 137 136 139  K 3.8 3.7 3.7 3.7 3.6  CL 109 108 108 106 108  CO2 GLUCOSE 111* 127* 132* 135* 133*  BUN CREATININE 1.34* 1.24 1.31* 1.28* 1.21  CALCIUM 7.9* 7.9* 8.3* 7.9* 8.1*  MG  --  1.8  --  2.0 1.9   Assessment & Plan: Overall patient appears to be stable vital signs are stable and his white count is down to normal limits. BUN and creatinine are within normal limits. Blood sugars have stayed the low 100s. Recommend patient be very very careful to avoid any weight on the foot with the TMA. He was sitting up in a chair and fairly comfortable at my evaluation time. Dr. Ether Griffins will see tomorrow for any  discharge planning may be necessary for the foot.    Active Problems:   Atherosclerosis of lower extremity with gangrene [facility])     Family Communication: Plan discussed with patient and family   Epimenio Sarin M.D on 06/27/2015 at 12:41 PM  Thank you for the consult, we will follow the patient with you in the Hospital.

## 2015-06-27 NOTE — Progress Notes (Signed)
Physical Therapy Treatment Patient Details Name: Edward DessMartin Luther Koudelka Boyer MRN: [id number] DOB: December 06, 1960 Today's Date: 06/27/2015     History of Present Illness Pt is a 55 y.o. male presenting to hospital with gangrene R 2nd toe (recent puncture wound with foreign body nail) and [facility].  Pt admitted with atherosclerotic occlusive disease B LE's associated with gangrene R 2nd toe.  Pt s/p R 2nd toe and metatarsal head and R 3rd metatarsal head amputation d/t osteomyelitis and also debridement d/t abscess R great toe 06/19/17 with wound vac placement.  Had R foot transmetatarsal amputation 6/23.  PMH includes DM, MI, CAD, htn, PVD, stroke, CABG (in February).    PT Comments    Pt did well with PT today and was able to increase ambulation distance as well as quality of gait and appeared to have less fatigue after the effort today. Pt is still somewhat hesitant to go home, but overall he agrees that in-home distances appear to be doable for him and that regarding mobility he is doing quite well.  Pt is able to maintain NWBing t/o the effort.  Follow Up Recommendations  Home health PT;SNF     Equipment Recommendations  Rolling walker with 5" wheels    Recommendations for Other Services       Precautions / Restrictions Precautions Precautions: Fall Restrictions RLE Weight Bearing: Non weight bearing    Mobility  Bed Mobility Overal bed mobility: Independent             General bed mobility comments: Pt able to get up so EOB w/o issue  Transfers Overall transfer level: Independent Equipment used: Rolling walker (2 wheeled) Transfers: Sit to/from Stand Sit to Stand: Supervision         General transfer comment: Pt able to rise using the walker w/o issue.  He does well to keep weight off R LE.    Ambulation/Gait Ambulation/Gait assistance: Supervision Ambulation Distance (Feet): 75 Feet Assistive device: Rolling walker (2 wheeled)        General Gait Details: Pt did better today with ambulation with more consistent and longer length hops and less fatigue in U&LEs.  He has no LOBs and generally displayed appropriate confidence for in-home distances.   Stairs            Wheelchair Mobility    Modified Rankin (Stroke Patients Only)       Balance                                    Cognition Arousal/Alertness: Awake/alert Behavior During Therapy: WFL for tasks assessed/performed Overall Cognitive Status: Within Functional Limits for tasks assessed                      Exercises General Exercises - Lower Extremity Ankle Circles/Pumps: AROM;10 reps Quad Sets: Strengthening;10 reps Gluteal Sets: Strengthening;10 reps Hip ABduction/ADduction: Strengthening;10 reps Straight Leg Raises: Strengthening;10 reps    General Comments        Pertinent Vitals/Pain Pain Assessment: No/denies pain    Home Living                      Prior Function            PT Goals (current goals can now be found in the care plan section) Progress towards PT goals: Progressing toward goals    Frequency  7X/week  PT Plan Current plan remains appropriate    Co-evaluation             End of Session Equipment Utilized During Treatment: Gait belt Activity Tolerance: Patient tolerated treatment well Patient left: with call bell/phone within reach;with chair alarm set;with family/visitor present     Time: 0907-0933 [id number] Time Calculation (min) (ACUTE ONLY): 26 min  Charges:  $Gait Training: 8-22 mins $Therapeutic Exercise: 8-22 mins                    G Codes:      Malachi ProGalen R Loneta Tamplin, DPT 06/27/2015, 12:35 PM

## 2015-06-27 NOTE — Progress Notes (Signed)
[facility] [place] [facility]   PATIENT NAME: Edward Boyer    MR#:  [id number]  DATE OF BIRTH:  30-Aug-1960  SUBJECTIVE:  CHIEF COMPLAINT:  No chief complaint on file.  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  REVIEW OF SYSTEMS:  Review of Systems  Constitutional: Negative for fever, chills and malaise/fatigue.  HENT: Negative for ear discharge, ear pain and nosebleeds.   Eyes: Negative for blurred vision and double vision.  Respiratory: Negative for cough, shortness of breath and wheezing.   Cardiovascular: Negative for chest pain, palpitations and leg swelling.  Gastrointestinal: Positive for constipation. Negative for nausea, vomiting, abdominal pain and diarrhea.  Genitourinary: Negative for dysuria and urgency.  Musculoskeletal: Positive for joint pain. Negative for myalgias.  Neurological: Negative for dizziness, sensory change, speech change, focal weakness, seizures and headaches.  Psychiatric/Behavioral: Negative for depression. The patient is not nervous/anxious.     DRUG ALLERGIES:   Allergies  Allergen Reactions  . Penicillins     VITALS:  Blood pressure 105/60, pulse 84, temperature 98.5 F (36.9 C), temperature source Oral, resp. rate 17, height 5\' 11"  (1.803 m), weight 104.237 kg (229 lb 12.8 oz), SpO2 99 %.  PHYSICAL EXAMINATION:  Physical Exam  GENERAL:  55 y.o.-year-old patient lying in the bed with no acute distress.  EYES: Pupils equal, round, reactive to light and accommodation. No scleral icterus. Extraocular muscles intact.  HEENT: Head atraumatic, normocephalic. Oropharynx and nasopharynx clear.  NECK:  Supple, no jugular venous distention. No thyroid enlargement, no tenderness.  LUNGS: Normal breath sounds bilaterally, no wheezing, rales,rhonchi or crepitation.  No use of accessory muscles of respiration. Decreased bibasilar breath sounds CARDIOVASCULAR: S1, S2 normal. No murmurs, rubs, or gallops. CABG changes on the chest. ABDOMEN: Soft, nontender, nondistended. Bowel sounds present. No organomegaly or mass.  EXTREMITIES: No pedal edema, cyanosis, or clubbing. Right foot dressing in place.  NEUROLOGIC: Cranial nerves II through XII are intact. Muscle strength 5/5 in all extremities. Sensation intact. Gait not checked.  PSYCHIATRIC: The patient is alert and oriented x 3.  SKIN: No obvious rash, lesion, or ulcer.    LABORATORY PANEL:   CBC  Recent Labs Lab 06/27/15 0306  WBC 9.6  HGB 8.0*  HCT 23.8*  PLT 246   ------------------------------------------------------------------------------------------------------------------  Chemistries   Recent Labs Lab 06/27/15 0306  NA 139  K 3.6  CL 108  CO2 24  GLUCOSE 133*  BUN 17  CREATININE 1.21  CALCIUM 8.1*  MG 1.9   ------------------------------------------------------------------------------------------------------------------  Cardiac Enzymes No results for input(s): TROPONINI in the last 168 hours. ------------------------------------------------------------------------------------------------------------------  RADIOLOGY:  No results found.  EKG:  No orders found for this or any previous visit.  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  - on ancef and  Levaquin. Infectious disease consulted - Has a PICC line now and will need  2 weeks of IV ABX - s/p angiogram and angioplasty to right peroneal this admission - on asa and plavix - depending on his performance with PT- he may go to home or Rehab.  #2 Anemia of chronic disease- stable and >8, no indication for transfusion for now Monitor  #3 [facility] s/p [facility]- stable for now - on asa, plavix and statin - coreg and lisinopril were held due to low BP - but BP better, start low dose coreg Has ischemic cardiomyopathy and last known EF of 30%  #4 diabetes mellitus-  home dose of 70/30 insulin and  Metformin are on hold now as sugars consistently on lower side -Currently only on sliding scale insulin -Monitor to restart medications - a1c is 6.7, sugars well controlled  #5 CKD stage III-stable at this time monitor on antibiotics  #6 DVT prophylaxis-on Lovenox  #7 Constipation- meds added   Physical Therapy consulted, recommended HHA with PT- Likely d/c Monday.   All the records are reviewed and case discussed with Care Management/Social Workerr. Management plans discussed with the patient, family and they are in agreement.  CODE STATUS: Full code  TOTAL TIME TAKING CARE OF THIS PATIENT: 35 minutes.   POSSIBLE D/C EARLY NEXT WEEK, DEPENDING ON CLINICAL CONDITION.   Altamese DillingVACHHANI, Demitri Kucinski M.D on 06/27/2015 at 2:27 PM  Between 7am to 6pm - Pager - [phone]  After 6pm go to [email or web address] - password [email or web address] [facility]  [facility]  Office  [phone]  CC: Primary care physician; No PCP Per Patient

## 2015-06-28 LAB — BASIC METABOLIC PANEL
Anion gap: 6 (ref 5–15)
BUN: 16 mg/dL (ref 6–20)
CHLORIDE: 107 mmol/L (ref 101–111)
CO2: 25 mmol/L (ref 22–32)
CREATININE: 1.19 mg/dL (ref 0.61–1.24)
Calcium: 8.1 mg/dL — ABNORMAL LOW (ref 8.9–10.3)
Glucose, Bld: 115 mg/dL — ABNORMAL HIGH (ref 65–99)
POTASSIUM: 3.7 mmol/L (ref 3.5–5.1)
SODIUM: 138 mmol/L (ref 135–145)

## 2015-06-28 LAB — CBC
HEMATOCRIT: 23 % — AB (ref 40.0–52.0)
HEMOGLOBIN: 7.8 g/dL — AB (ref 13.0–18.0)
MCH: 25.8 pg — AB (ref 26.0–34.0)
MCHC: 34 g/dL (ref 32.0–36.0)
MCV: 76 fL — ABNORMAL LOW (ref 80.0–100.0)
Platelets: 280 10*3/uL (ref 150–440)
RBC: 3.03 MIL/uL — AB (ref 4.40–5.90)
RDW: 17.9 % — ABNORMAL HIGH (ref 11.5–14.5)
WBC: 8.6 10*3/uL (ref 3.8–10.6)

## 2015-06-28 LAB — MAGNESIUM: MAGNESIUM: 1.9 mg/dL (ref 1.7–2.4)

## 2015-06-28 LAB — GLUCOSE, CAPILLARY
GLUCOSE-CAPILLARY: 153 mg/dL — AB (ref 65–99)
Glucose-Capillary: 108 mg/dL — ABNORMAL HIGH (ref 65–99)

## 2015-06-28 MED ORDER — CEFAZOLIN IN D5W 1 GM/50ML IV SOLN: 1.0000 g | Freq: Three times a day (TID) | INTRAVENOUS | Status: AC

## 2015-06-28 MED ORDER — LEVOFLOXACIN 750 MG PO TABS: 750.0000 mg | ORAL_TABLET | Freq: Every day | ORAL | Status: AC

## 2015-06-28 MED ORDER — MORPHINE SULFATE (PF) 2 MG/ML IV SOLN: 2.0000 mg | INTRAVENOUS | Status: AC | PRN

## 2015-06-28 MED ORDER — SENNA 8.6 MG PO TABS: 2.0000 | ORAL_TABLET | Freq: Two times a day (BID) | ORAL | Status: AC

## 2015-06-28 MED ORDER — INSULIN ASPART 100 UNIT/ML ~~LOC~~ SOLN: 0.0000 [IU] | Freq: Three times a day (TID) | SUBCUTANEOUS | Status: AC

## 2015-06-28 MED ORDER — ONDANSETRON HCL 4 MG PO TABS: 4.0000 mg | ORAL_TABLET | Freq: Four times a day (QID) | ORAL | Status: AC | PRN

## 2015-06-28 MED ORDER — MAGNESIUM HYDROXIDE 400 MG/5ML PO SUSP: 30.0000 mL | Freq: Every day | ORAL | Status: AC | PRN

## 2015-06-28 MED ORDER — ZOLPIDEM TARTRATE 5 MG PO TABS: 5.0000 mg | ORAL_TABLET | Freq: Every evening | ORAL | Status: AC | PRN

## 2015-06-28 MED ORDER — LACTULOSE 10 GM/15ML PO SOLN: 20.0000 g | Freq: Two times a day (BID) | ORAL | Status: AC | PRN

## 2015-06-28 MED ORDER — BISACODYL 5 MG PO TBEC: 10.0000 mg | DELAYED_RELEASE_TABLET | Freq: Every day | ORAL | Status: AC | PRN

## 2015-06-28 MED ORDER — DOCUSATE SODIUM 100 MG PO CAPS: 100.0000 mg | ORAL_CAPSULE | Freq: Two times a day (BID) | ORAL | Status: AC

## 2015-06-28 NOTE — Care Management (Signed)
Per CSW, have received inpatient auth for skilled nursing.  Anticipate discharge today.

## 2015-06-28 NOTE — Discharge Summary (Signed)
[facility]    Discharge Summary    Patient ID:  Edward Boyer MRN: [id number] DOB/AGE: Aug 08, 1960 55 y.o.  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 Diabetes mellitus without complication (HCC)   . Myocardial infarction (HCC)   . Coronary artery disease   . Hypertension   . Peripheral vascular disease (HCC)   . Stroke [facility](HCC)     Procedure(s): RIGHT TRANSMETATARSAL AMPUTATION  Discharged Condition: good  HPI:  The patient was sent to me by Dr. Loura BackMark Byrd [place] with a diabetic foot abscess as well as atherosclerotic occlusive disease of the lower extremities in association with gangrenous changes to the right second toe. On admission he was started on antibiotics. Dr. Ether GriffinsFowler evaluated the patient and on June 18 took the patient to surgery where he debrided the necrotic toe as well as drained the foot abscess. He was subsequently seen by medicine for his diabetes as well as infectious disease. Antibiotics were adjusted based on the culture from surgery. Then on 06/21/2015 [facility] where right lower 70 angiography demonstrated significant tibial disease with occlusion of all 3 tibial vessels. The anterior tibial was nonvisualized throughout its entire course the peroneal demonstrated occlusion through its proximal 6-10 cm and the posterior tibial was occluded throughout its proximal two thirds but did reconstitute at the ankle level. I was able to cross the occluded peroneal and after angioplasty to 3 mm he now had in-line flow to the ankle with a large collateral filling the  lateral and medial plantar vessels.  Subsequently on 06/25/2015 he was returned to the operating room by Dr. Ether GriffinsFowler were formal transmetatarsal amputation was performed. He has done well since surgery.  His wound cultures from the first surgery were returned staph aureus sensitive to penicillin and he has been continued on Ancef postoperatively he is also been on Levaquin for stenotrophomonas. He has been recommended for a 2 week course of IV antibiotics and will follow up with Dr. Sampson GoonFitzgerald to see if further treatment with oral agent will be performed.  Hospital Course:  Edward DessMartin Luther Pappalardo Boyer is a 55 y.o. male is S/P Right Procedure(s): RIGHT TRANSMETATARSAL AMPUTATION Extubated: POD # 0 Physical examright transmetatarsal amputation site clean dry and intact pedal pulses not palpable Post-op wounds clean, dry, intact or healing well Pt. Ambulating, voiding and taking PO diet without difficulty. Pt pain controlled with PO pain meds. Labs as below Complications:none  Consults:  Treatment Team:  Gwyneth RevelsJustin Fowler, DPM Houston SirenVivek J Sainani, MD Enid Baasadhika Kalisetti, MD Mick Sellavid P Fitzgerald, MD  Significant Diagnostic Studies: CBC Lab Results  Component Value Date   WBC 8.6 06/28/2015   HGB 7.8* 06/28/2015   HCT 23.0* 06/28/2015   MCV 76.0* 06/28/2015   PLT 280 06/28/2015    BMET    Component Value Date/Time   NA 138 06/28/2015 0656   K 3.7 06/28/2015 0656   CL 107 06/28/2015 0656   CO2 25 06/28/2015 0656   GLUCOSE 115* 06/28/2015 0656   BUN 16 06/28/2015 0656   CREATININE 1.19 06/28/2015 0656   CALCIUM 8.1* 06/28/2015 0656   GFRNONAA >60 06/28/2015 0656   GFRAA >60  06/28/2015 0656   COAG No results found for: INR, PROTIME   Disposition:  Discharge to :Skilled nursing facility    Medication List    ASK your doctor about these medications        acidophilus Caps capsule  Take by mouth 2 (two) times daily.     aspirin 81 MG tablet  Take 81 mg by mouth daily.      atorvastatin 80 MG tablet  Commonly known as:  LIPITOR  Take 80 mg by mouth at bedtime.     carvedilol 25 MG tablet  Commonly known as:  COREG  Take 25 mg by mouth 2 (two) times daily with a meal.     clopidogrel 75 MG tablet  Commonly known as:  PLAVIX  Take 75 mg by mouth daily.     insulin NPH-regular Human (70-30) 100 UNIT/ML injection  Commonly known as:  NOVOLIN 70/30  Inject 30 Units into the skin daily with breakfast.     insulin NPH-regular Human (70-30) 100 UNIT/ML injection  Commonly known as:  NOVOLIN 70/30  Inject 10 Units into the skin daily with supper.     lisinopril 2.5 MG tablet  Commonly known as:  PRINIVIL,ZESTRIL  Take 2.5 mg by mouth daily.     metFORMIN 1000 MG tablet  Commonly known as:  GLUCOPHAGE  Take 1,000 mg by mouth daily before supper.     nitroGLYCERIN 0.4 mg/hr patch  Commonly known as:  NITRODUR - Dosed in mg/24 hr  Place 0.4 mg under the tongue every 5 (five) minutes x 3 doses as needed.     oxyCODONE-acetaminophen 5-325 MG tablet  Commonly known as:  PERCOCET/ROXICET  Take 1 tablet by mouth every 4 (four) hours as needed for severe pain.       Verbal and written Discharge instructions given to the patient. Wound care per Discharge AVS   Signed: Renford DillsSchnier, Gregory G, MD  06/28/2015, 12:49 PM

## 2015-06-28 NOTE — Progress Notes (Signed)
PT Cancellation Note  Patient Details Name: Edward Boyer MRN: [id number] DOB: 09-Oct-1960   Cancelled Treatment:    Reason Eval/Treat Not Completed: Patient at procedure or test/unavailable Pt currently with NT performing pt care (bathing).  Will continue efforts at later time   [name] 06/28/2015, 10:42 AM [name], PTA

## 2015-06-28 NOTE — Clinical Social Work Note (Signed)
Patient to discharge today to go to [facility] [place]. CSW has spoken with Zella [facility]Robin at [facility] several times today in order to coordinate patient's discharge. [facility]charge [facility] has been [facility]. Podiatry is aware of discharge and has put in a separate progress note the wound care instructions for patient's foot. Patient's nurse to call report. Patient's mother to transport. York SpanielMonica Shareka Casale MSW,LCSW [phone]

## 2015-06-28 NOTE — Progress Notes (Signed)
Physical Therapy Treatment Patient Details Name: Edward Boyer MRN: [id number] DOB: 1960-10-30 Today's Date: 06/28/2015    History of Present Illness Pt is a 55 y.o. male presenting to hospital with gangrene R 2nd toe (recent puncture wound with foreign body nail) and [facility]  Pt admitted with atherosclerotic occlusive disease B LE's associated with gangrene R 2nd toe.  Pt s/p R 2nd toe and metatarsal head and R 3rd metatarsal head amputation d/t osteomyelitis and also debridement d/t abscess R great toe 06/19/17 with wound vac placement.  Had R foot transmetatarsal amputation 6/23.  PMH includes DM, MI, CAD, htn, PVD, stroke, CABG (in February).    PT Comments    Pt presented in chair expressing increased fatigue 2/2 having just completed bathing  however willing to participate in therapy.  Pt performed sit to stand with CGA/supervision verbalizing and demonstrating good safety and maintaining NWB restrictions.  Pt able to ambulate approx 1960ft x1 with RW hopping demonstrating good hop length and good safety. Pt with noted fatigue requesting limiting therapy session 2/2 fatigue.   Follow Up Recommendations  Home health PT     Equipment Recommendations  Rolling walker with 5" wheels    Recommendations for Other Services       Precautions / Restrictions Precautions Precautions: Fall Restrictions Weight Bearing Restrictions: Yes RLE Weight Bearing: Non weight bearing    Mobility  Bed Mobility               General bed mobility comments: Pt presented in bed   Transfers Overall transfer level: Modified independent Equipment used: Rolling walker (2 wheeled) Transfers: Sit to/from Stand Sit to Stand: Supervision         General transfer comment: PT demonstrated good safety and awareness of maintaining NWB status   Ambulation/Gait Ambulation/Gait assistance: Supervision Ambulation Distance (Feet): 60 Feet Assistive device: Rolling  walker (2 wheeled)       General Gait Details: Pt demostrated good hop length and maintaining NWB status.    Stairs            Wheelchair Mobility    Modified Rankin (Stroke Patients Only)       Balance Overall balance assessment: Modified Independent                                  Cognition Arousal/Alertness: Awake/alert Behavior During Therapy: WFL for tasks assessed/performed Overall Cognitive Status: Within Functional Limits for tasks assessed                      Exercises      General Comments        Pertinent Vitals/Pain Pain Assessment: No/denies pain    Home Living                      Prior Function            PT Goals (current goals can now be found in the care plan section) Progress towards PT goals: Progressing toward goals    Frequency  7X/week    PT Plan Current plan remains appropriate    Co-evaluation             End of Session Equipment Utilized During Treatment: Gait belt Activity Tolerance: Patient tolerated treatment well;Patient limited by fatigue Patient left: in chair;with call bell/phone within reach;with family/visitor present     Time: 5784-69621125-1137  PT Time Calculation (min) (ACUTE ONLY): 12 min  Charges:  $Gait Training: 8-22 mins                    G Codes:      Breanne Olvera 06/28/2015, 1:00 PM  Shakemia Madera, PTA

## 2015-06-28 NOTE — Clinical Social Work Placement (Signed)
   CLINICAL SOCIAL WORK PLACEMENT  NOTE  Date:  06/28/2015  Patient Details  Name: Edward Boyer MRN: [id number] Date of Birth: Jun 17, 1960  Clinical Social Work is seeking post-discharge placement for this patient at the Skilled  Nursing Facility level of care (*CSW will initial, date and re-position this form in  chart as items are completed):  Yes   Patient/family provided with [facility] Clinical [facility] Work Department's list of facilities offering this level of care within the geographic area requested by the patient (or if unable, by the patient's family).  Yes   Patient/family informed of their freedom to choose among providers that offer the needed level of care, that participate in Medicare, Medicaid or managed care program needed by the patient, have an available bed and are willing to accept the patient.  Yes   Patient/family informed of [facility] ownership interest in [facility] and [facility], as well as of the fact that they are under no obligation to receive care at these facilities.  PASRR submitted to EDS on       PASRR number received on       Existing PASRR number confirmed on       FL2 transmitted to all facilities in geographic area requested by pt/family on 06/21/15     FL2 transmitted to all facilities within larger geographic area on       Patient informed that his/her managed care company has contracts with or will negotiate with certain facilities, including the following:        Yes   Patient/family informed of bed offers received.  Patient [facility] bed at  [facility]     Physician recommends and patient chooses bed at  [facility])    Patient to be transferred to  [facility] on 06/28/15.  Patient to be transferred to facility by  (mother)     Patient family notified on 06/28/15 of transfer.  Name of family member notified:   (mother)     PHYSICIAN       Additional Comment:     _______________________________________________ York SpanielMonica Kenya Shiraishi, LCSW 06/28/2015, 2:21 PM

## 2015-06-28 NOTE — Clinical Social Work Note (Signed)
CSW has contacted Robin with [facility] to request where they are in the process of obtaining authorization from [employer]. Zella BallRobin stated she will call [employer]. CSW informed her that it is possible that patient will be ready for discharge today. York SpanielMonica Hamp Moreland MSW,LCSW [phone]

## 2015-06-28 NOTE — Progress Notes (Signed)
[facility] [place] [facility]   PATIENT NAME: Edward BlackwaterMartin Petsch    MR#:  [id number]  DATE OF BIRTH:  1960/03/08  SUBJECTIVE:  CHIEF COMPLAINT:  No chief complaint on file.  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 Able to walk.  REVIEW OF SYSTEMS:  Review of Systems  Constitutional: Negative for fever, chills and malaise/fatigue.  HENT: Negative for ear discharge, ear pain and nosebleeds.   Eyes: Negative for blurred vision and double vision.  Respiratory: Negative for cough, shortness of breath and wheezing.   Cardiovascular: Negative for chest pain, palpitations and leg swelling.  Gastrointestinal: Positive for constipation. Negative for nausea, vomiting, abdominal pain and diarrhea.  Genitourinary: Negative for dysuria and urgency.  Musculoskeletal: Positive for joint pain. Negative for myalgias.  Neurological: Negative for dizziness, sensory change, speech change, focal weakness, seizures and headaches.  Psychiatric/Behavioral: Negative for depression. The patient is not nervous/anxious.     DRUG ALLERGIES:   Allergies  Allergen Reactions  . Penicillins     VITALS:  Blood pressure 93/57, pulse 85, temperature 97.8 F (36.6 C), temperature source Oral, resp. rate 19, height 5\' 11"  (1.803 m), weight 103.193 kg (227 lb 8 oz), SpO2 100 %.  PHYSICAL EXAMINATION:  Physical Exam  GENERAL:  55 y.o.-year-old patient lying in the bed with no acute distress.  EYES: Pupils equal, round, reactive to light and accommodation. No scleral icterus. Extraocular muscles intact.  HEENT: Head atraumatic, normocephalic. Oropharynx and nasopharynx clear.  NECK:  Supple, no jugular venous distention. No thyroid enlargement, no tenderness.  LUNGS: Normal breath sounds bilaterally, no wheezing, rales,rhonchi or  crepitation. No use of accessory muscles of respiration. Decreased bibasilar breath sounds CARDIOVASCULAR: S1, S2 normal. No murmurs, rubs, or gallops. CABG changes on the chest. ABDOMEN: Soft, nontender, nondistended. Bowel sounds present. No organomegaly or mass.  EXTREMITIES: No pedal edema, cyanosis, or clubbing. Right foot dressing in place.  NEUROLOGIC: Cranial nerves II through XII are intact. Muscle strength 5/5 in all extremities. Sensation intact. Gait not checked.  PSYCHIATRIC: The patient is alert and oriented x 3.  SKIN: No obvious rash, lesion, or ulcer.    LABORATORY PANEL:   CBC  Recent Labs Lab 06/28/15 0656  WBC 8.6  HGB 7.8*  HCT 23.0*  PLT 280   ------------------------------------------------------------------------------------------------------------------  Chemistries   Recent Labs Lab 06/28/15 0656  NA 138  K 3.7  CL 107  CO2 25  GLUCOSE 115*  BUN 16  CREATININE 1.19  CALCIUM 8.1*  MG 1.9   ------------------------------------------------------------------------------------------------------------------  Cardiac Enzymes No results for input(s): TROPONINI in the last 168 hours. ------------------------------------------------------------------------------------------------------------------  RADIOLOGY:  No results found.  EKG:  No orders found for this or any previous visit.  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  - on  ancef and Levaquin. Infectious disease consulted - Has a PICC line now  and will need 2 weeks of IV ABX - s/p angiogram and angioplasty to right peroneal this admission - on asa and plavix - plan is to d/c to rehab today.  #2 Anemia of chronic disease- stable and >8, no indication for transfusion for now Monitor  #3 [facility]- stable for now - on asa, plavix and statin - coreg and lisinopril were held due to low BP - but BP better, start low dose coreg Has ischemic cardiomyopathy and last known EF of 30%  #4 diabetes mellitus-  home dose of 70/30 insulin and  Metformin are on hold now as sugars consistently on lower side -Currently only on sliding scale insulin -Monitor to restart medications - a1c is 6.7, sugars well controlled  #5 CKD stage III-stable at this time monitor on antibiotics  #6 DVT prophylaxis-on Lovenox  #7 Constipation- meds added  D/c today to rehab per primary team.  Cont current meds.   I will sign off.  All the records are reviewed and case discussed with Care Management/Social Workerr. Management plans discussed with the patient, family and they are in agreement.  CODE STATUS: Full code  TOTAL TIME TAKING CARE OF THIS PATIENT: 35 minutes.     Altamese DillingVACHHANI, Shaye Elling M.D on 06/28/2015 at 4:51 PM  Between 7am to 6pm - Pager - [phone]  After 6pm go to [email or web address] - password [email or web address] [facility]  [facility]  Office  [phone]  CC: Primary care physician; No PCP Per Patient

## 2015-06-28 NOTE — Progress Notes (Signed)
Pt to be d/c'd today.   OK from podiatry standpoint.  Recommend pt f/u with me in 1 week in outpt clinic.  Recommend bulky dry dressing to right foot surgical site 3x's per week.  Cleanse wound with saline and pat dry.  Apply ABD's and gently wrap with kerlex.  Can apply non-adherent dressing with silvadene to dorsal right midfoot foot wound and padded dressing with each dressing change.  Pt to remain strictly NWB to right foot.  Should wear heel protector boots while non ambulating.

## 2015-06-28 NOTE — Progress Notes (Signed)
Alert and oriented. Vital signs stable . No signs of acute distress. [facility]  No other issues noted at this time. Transported via mother.

## 2015-06-28 NOTE — Discharge Instructions (Signed)
Dressing changes to right foot:

## 2015-06-29 LAB — SURGICAL PATHOLOGY

## 2015-09-03 DEATH — deceased

## 2015-09-22 ENCOUNTER — Encounter: Payer: Self-pay | Admitting: *Deleted

## 2016-02-09 ENCOUNTER — Encounter (INDEPENDENT_AMBULATORY_CARE_PROVIDER_SITE_OTHER): Payer: Self-pay

## 2016-02-09 ENCOUNTER — Other Ambulatory Visit (INDEPENDENT_AMBULATORY_CARE_PROVIDER_SITE_OTHER): Payer: Self-pay

## 2016-02-09 ENCOUNTER — Ambulatory Visit (INDEPENDENT_AMBULATORY_CARE_PROVIDER_SITE_OTHER): Payer: Self-pay | Admitting: Vascular Surgery

## 2016-11-07 IMAGING — DX DG CHEST 1V PORT
1 series · 1 of 1 positions shown · non-contrast
Comparison: None.

CLINICAL DATA: PICC placement.  Initial encounter.

EXAM:
PORTABLE CHEST 1 VIEW

[chest ap]
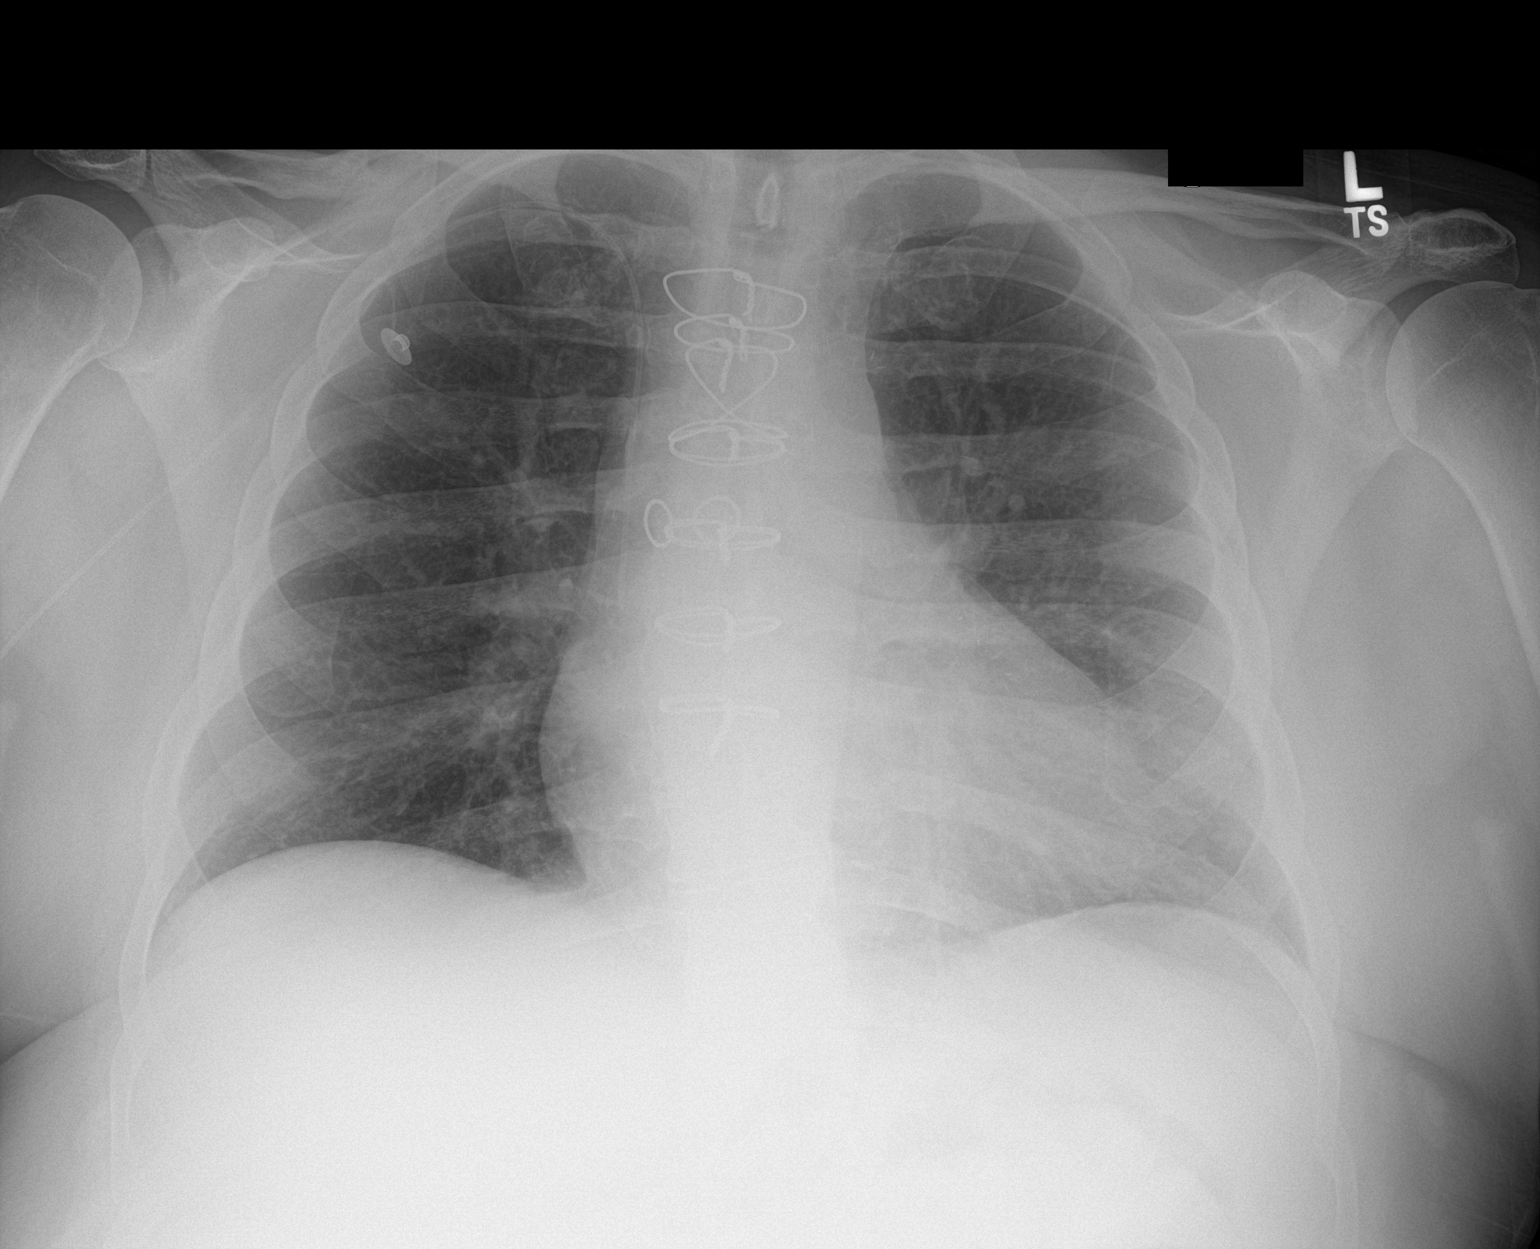

[1 of 1 positions shown; findings below may reference images not displayed]

FINDINGS: The patient's right PICC is noted ending about the distal SVC.

The lungs are well-aerated and clear. There is no evidence of focal
opacification, pleural effusion or pneumothorax.

The cardiomediastinal silhouette is borderline normal in size. The
patient is status post median sternotomy, with evidence of prior
CABG. No acute osseous abnormalities are seen. There is chronic
deformity of the right clavicle.
IMPRESSION: 1. Right PICC noted ending about the distal SVC.
2. No acute cardiopulmonary process seen.
